# Patient Record
Sex: Female | Born: 1950 | Race: White | Hispanic: No | Marital: Married | Smoking: Never smoker
Health system: Southern US, Community
[De-identification: ages and names within clinical notes are randomized; demographics above are authoritative.]

## PROBLEM LIST (undated history)

## (undated) DIAGNOSIS — E785 Hyperlipidemia, unspecified: Secondary | ICD-10-CM

## (undated) DIAGNOSIS — Z9221 Personal history of antineoplastic chemotherapy: Secondary | ICD-10-CM

## (undated) DIAGNOSIS — C50919 Malignant neoplasm of unspecified site of unspecified female breast: Secondary | ICD-10-CM

## (undated) DIAGNOSIS — E119 Type 2 diabetes mellitus without complications: Secondary | ICD-10-CM

## (undated) DIAGNOSIS — Z923 Personal history of irradiation: Secondary | ICD-10-CM

## (undated) DIAGNOSIS — I10 Essential (primary) hypertension: Secondary | ICD-10-CM

## (undated) HISTORY — DX: Type 2 diabetes mellitus without complications: E11.9

## (undated) HISTORY — DX: Hyperlipidemia, unspecified: E78.5

## (undated) HISTORY — DX: Essential (primary) hypertension: I10

## (undated) MED FILL — Trastuzumab-anns For IV Soln 150 MG: INTRAVENOUS | Qty: 22 | Status: AC

## (undated) MED FILL — Dexamethasone Sodium Phosphate Inj 100 MG/10ML: INTRAMUSCULAR | Qty: 1 | Status: AC

## (undated) MED FILL — Fosaprepitant Dimeglumine For IV Infusion 150 MG (Base Eq): INTRAVENOUS | Qty: 5 | Status: AC

---

## 2017-09-17 ENCOUNTER — Other Ambulatory Visit: Payer: Self-pay | Admitting: Nurse Practitioner

## 2017-09-17 DIAGNOSIS — Z1231 Encounter for screening mammogram for malignant neoplasm of breast: Secondary | ICD-10-CM

## 2017-10-21 ENCOUNTER — Ambulatory Visit
Admission: RE | Admit: 2017-10-21 | Discharge: 2017-10-21 | Disposition: A | Discharge status: Home / Self Care | Payer: Medicare Other | Source: Ambulatory Visit | Attending: Nurse Practitioner | Admitting: Nurse Practitioner

## 2017-10-21 DIAGNOSIS — Z1231 Encounter for screening mammogram for malignant neoplasm of breast: Secondary | ICD-10-CM

## 2019-12-24 ENCOUNTER — Encounter: Payer: Self-pay | Admitting: Internal Medicine

## 2020-02-23 ENCOUNTER — Encounter: Payer: Self-pay | Admitting: Internal Medicine

## 2020-02-23 ENCOUNTER — Ambulatory Visit (INDEPENDENT_AMBULATORY_CARE_PROVIDER_SITE_OTHER): Payer: Medicare Other | Admitting: Internal Medicine

## 2020-02-23 ENCOUNTER — Other Ambulatory Visit: Payer: Self-pay

## 2020-02-23 VITALS — BP 140/78 | HR 86 | Ht 63.0 in | Wt 169.2 lb

## 2020-02-23 DIAGNOSIS — E785 Hyperlipidemia, unspecified: Secondary | ICD-10-CM | POA: Diagnosis not present

## 2020-02-23 DIAGNOSIS — E1165 Type 2 diabetes mellitus with hyperglycemia: Secondary | ICD-10-CM

## 2020-02-23 DIAGNOSIS — Z794 Long term (current) use of insulin: Secondary | ICD-10-CM

## 2020-02-23 HISTORY — DX: Type 2 diabetes mellitus with hyperglycemia: E11.65

## 2020-02-23 LAB — POCT GLYCOSYLATED HEMOGLOBIN (HGB A1C): Hemoglobin A1C: 10.8 % — AB (ref 4.0–5.6)

## 2020-02-23 LAB — GLUCOSE, POCT (MANUAL RESULT ENTRY): POC Glucose: 312 mg/dl — AB (ref 70–99)

## 2020-02-23 MED ORDER — NOVOLOG FLEXPEN 100 UNIT/ML ~~LOC~~ SOPN: 8.0000 [IU] | PEN_INJECTOR | Freq: Three times a day (TID) | SUBCUTANEOUS | 3 refills | Status: DC

## 2020-02-23 MED ORDER — DEXCOM G6 TRANSMITTER MISC: 1.0000 | 3 refills | Status: DC

## 2020-02-23 MED ORDER — INSULIN PEN NEEDLE 32G X 4 MM MISC: 1.0000 | Freq: Four times a day (QID) | 3 refills | Status: DC

## 2020-02-23 MED ORDER — DEXCOM G6 SENSOR MISC: 1.0000 | 11 refills | Status: DC

## 2020-02-23 NOTE — Patient Instructions (Addendum)
-   Continue Ozempic 0.5 mg weekly  - Continue Tresiba 40 units daily  - Start Novolog 8 units with each meal      HOW TO TREAT LOW BLOOD SUGARS (Blood sugar LESS THAN 70 MG/DL)  Please follow the RULE OF 15 for the treatment of hypoglycemia treatment (when your (blood sugars are less than 70 mg/dL)    STEP 1: Take 15 grams of carbohydrates when your blood sugar is low, which includes:   3-4 GLUCOSE TABS  OR  3-4 OZ OF JUICE OR REGULAR SODA OR  ONE TUBE OF GLUCOSE GEL     STEP 2: RECHECK blood sugar in 15 MINUTES STEP 3: If your blood sugar is still low at the 15 minute recheck --> then, go back to STEP 1 and treat AGAIN with another 15 grams of carbohydrates.

## 2020-02-23 NOTE — Addendum Note (Signed)
Addended by: Kaylyn Lim I on: 02/23/2020 01:51 PM   Modules accepted: Orders

## 2020-02-23 NOTE — Progress Notes (Signed)
Rx for [employer] 2 have been faxed to [facility]

## 2020-02-23 NOTE — Progress Notes (Signed)
Name: Heather Waller  MRN/ DOB: [id number], [birth year]   Age/ Sex: 70 y.o., female    PCP: Charlynn Court, NP   Reason for Endocrinology Evaluation: Type 2 Diabetes Mellitus     Date of Initial Endocrinology Visit: 02/23/2020     PATIENT IDENTIFIER: Heather Waller is a 70 y.o. female with a past medical history of T2DM, HTN and dyslipidemia . The patient presented for initial endocrinology clinic visit on 02/23/2020 for consultative assistance with her diabetes management.    HPI: Heather Waller was    Diagnosed with DM many years ago  Prior Medications tried/Intolerance: Intolerant to Metformin due to diarrhea  Currently checking blood sugars 1 x / day Hypoglycemia episodes : no              Hemoglobin A1c has ranged from 10.7% in 2021, peaking at 10.8%  in 2022. Patient required assistance for hypoglycemia: no  Patient has required hospitalization within the last 1 year from hyper or hypoglycemia: no   In terms of diet, the patient eats a smoothie in the breakfast , eats lunch and supper . Avoids sugar-sweetened beverages.  Would sometimes snack mid morning ( on a cracker) .  Has constipation with Ozempic      HOME DIABETES REGIMEN: Ozempic 0.5 mg weekly  ( Wedensdays)  Tyler Aas 40 units daily  At 7 AM      Statin: no ACE-I/ARB: yes Prior Diabetic Education: yes     METER DOWNLOAD SUMMARY: Did not bring       DIABETIC COMPLICATIONS: Microvascular complications:    Denies: CKD, retinopathy, neuropathy   Last eye exam: Completed 2021  Macrovascular complications:    Denies: CAD, PVD, CVA   PAST HISTORY:  Past Medical History: No past medical history on file.  Past Surgical History: N/A   Social History:  reports that she has never smoked. She has never used smokeless tobacco. She reports previous alcohol use. No history on file for drug use.  Family History: No family history on file.   HOME MEDICATIONS: Allergies as of 02/23/2020   No  Known Allergies     Medication List       Accurate as of February 23, 2020  1:47 PM. If you have any questions, ask your nurse or doctor.        Dexcom G6 Sensor Misc 1 Device by Does not apply route as directed. Started by: Dorita Sciara, MD   Dexcom G6 Transmitter Misc 1 Device by Does not apply route as directed. Started by: Dorita Sciara, MD   Insulin Pen Needle 32G X 4 MM Misc 1 Device by Does not apply route in the morning, at noon, in the evening, and at bedtime. What changed:   how much to take  how to take this  when to take this Changed by: Dorita Sciara, MD   NovoLOG FlexPen 100 UNIT/ML FlexPen Generic drug: insulin aspart Inject 8 Units into the skin 3 (three) times daily with meals. Started by: Dorita Sciara, MD   Ozempic (0.25 or 0.5 MG/DOSE) 2 MG/1.5ML Sopn Generic drug: Semaglutide(0.25 or 0.5MG/DOS) Inject 0.5 mLs into the skin once a week.   Tyler Aas FlexTouch 200 UNIT/ML FlexTouch Pen Generic drug: insulin degludec Inject 40 Units into the skin daily.   valsartan-hydrochlorothiazide 80-12.5 MG tablet Commonly known as: DIOVAN-HCT Take 1 tablet by mouth daily.        ALLERGIES: No Known Allergies   REVIEW OF SYSTEMS: A comprehensive ROS was  conducted with the patient and is negative except as per HPI and below:  Review of Systems  Gastrointestinal: Positive for constipation. Negative for nausea.      OBJECTIVE:   VITAL SIGNS: BP 140/78   Pulse 86   Ht _0  (1.6 m)   Wt 169 lb 4 oz (76.8 kg)   SpO2 96%   BMI 29.98 kg/m     PHYSICAL EXAM:  General: Pt appears well and is in NAD  Neck: General: Supple without adenopathy or carotid bruits. Thyroid: Thyroid size normal.  No goiter or nodules appreciated.   Lungs: Clear with good BS bilat with no rales, rhonchi, or wheezes  Heart: RRR   Abdomen: Normoactive bowel sounds, soft, nontender, without masses or organomegaly palpable  Extremities:  Lower  extremities - No pretibial edema. No lesions.  Skin: Normal texture and temperature to palpation.   Neuro: MS is good with appropriate affect, pt is alert and Ox3    DM foot exam: 02/23/2019   The skin of the feet is intact without sores or ulcerations. The pedal pulses are 2+ on right and 2+ on left. The sensation is intact to a screening 5.07, 10 gram monofilament bilaterally    DATA REVIEWED: 11/30/2019 BUN/Cr 10/0.62  GFR 91  Na 137 K 4 Ca 9.3  Alk Phos 69 AST 23 ALT 25   T.Chol 195 Tg 164 HDL 50 LDL 116 A1c 10.7 %  TSH 1.390 B12 962  In Office BG 312 mg/dL   ASSESSMENT / PLAN / RECOMMENDATIONS:   1) Type 2 Diabetes Mellitus, Poorly controlled, Without complications - Most recent A1c of 10.8 %. Goal A1c < 7.0 %.    Plan: GENERAL: I have discussed with the patient the pathophysiology of diabetes. We went over the natural progression of the disease. We talked about both insulin resistance and insulin deficiency. We stressed the importance of lifestyle changes including diet and exercise. I explained the complications associated with diabetes including retinopathy, nephropathy, neuropathy as well as increased risk of cardiovascular disease. We went over the benefit seen with glycemic control.    I explained to the patient that diabetic patients are at higher than normal risk for amputations.  Pt with an A1c > 10.0 % since 07/2019 , given that she has hyperglycemia despite insulin and low carb diet, will check for autoimmune DM.   We discussed adding a prandial insulin, given BG in the office 312 mg/dL despite eating a sala, a small roll and unsweetened beverage. She is in agreement of this, she was advised ot check glucose before each meal, will prescribe Dexcom. She will notify us when she gets it so we can set her up for training   MEDICATIONS: -  Continue Ozempic 0.5 mg weekly  - Continue Tresiba 40 units daily  - Start Novolog 8 units with each meal    EDUCATION / INSTRUCTIONS:  BG monitoring instructions: Patient is instructed to check her blood sugars 3 times a day, before meals.  Call [facility] if: BG persistently < 70  . I reviewed the Rule of 15 for the treatment of hypoglycemia in detail with the patient. Literature supplied.   2) Diabetic complications:   Eye: Does not have known diabetic retinopathy.   Neuro/ Feet: Does not have known diabetic peripheral neuropathy.  Renal: Patient does nothave known baseline CKD. She is on an ACEI/ARB at present.   3) Dyslipidemia: Patient is not on a statin. LDL above goal at 116 mg/dL.  Discussed ADA recommendation for statin use as well as cardiovascular benefits, she declines.    F/U in 3 months      Signed electronically by: Mack Guise, MD  [facility] Endocrin[facility]  [facility] [address] [place], [place] [address] Phone: [phone] FAX: [phone]   CC: Charlynn Court, NP [address] [place] [address] Phone: [phone]  Fax: [phone]    Return to Endocrinology clinic as below: Future Appointments  Date Time Provider [place]  05/24/2020  1:40 PM Shamleffer, Melanie Crazier, MD [facility] None

## 2020-02-24 ENCOUNTER — Encounter: Payer: Self-pay | Admitting: Internal Medicine

## 2020-02-29 NOTE — Progress Notes (Signed)
Faxed Dexcom order to [facility] pharmacy on 1/13/202022.  Re-fax on 02/29/2020 because did not received confirmation from prior.

## 2020-03-02 LAB — GLUTAMIC ACID DECARBOXYLASE AUTO ABS: Glutamic Acid Decarb Ab: <5 IU/mL (ref <5)

## 2020-03-02 LAB — ISLET CELL AB SCREEN RFLX TO TITER: ISLET CELL ANTIBODY SCREEN: NEGATIVE

## 2020-04-06 ENCOUNTER — Other Ambulatory Visit: Payer: Self-pay | Admitting: Nurse Practitioner

## 2020-04-06 DIAGNOSIS — Z1231 Encounter for screening mammogram for malignant neoplasm of breast: Secondary | ICD-10-CM

## 2020-05-24 ENCOUNTER — Other Ambulatory Visit: Payer: Self-pay

## 2020-05-24 ENCOUNTER — Ambulatory Visit (INDEPENDENT_AMBULATORY_CARE_PROVIDER_SITE_OTHER): Payer: Medicare Other | Admitting: Internal Medicine

## 2020-05-24 ENCOUNTER — Encounter: Payer: Self-pay | Admitting: Internal Medicine

## 2020-05-24 VITALS — BP 132/82 | HR 88 | Ht 63.0 in | Wt 176.2 lb

## 2020-05-24 DIAGNOSIS — E1165 Type 2 diabetes mellitus with hyperglycemia: Secondary | ICD-10-CM

## 2020-05-24 LAB — POCT GLYCOSYLATED HEMOGLOBIN (HGB A1C): Hemoglobin A1C: 9 % — AB (ref 4.0–5.6)

## 2020-05-24 MED ORDER — DEXCOM G6 SENSOR MISC: 1.0000 | 11 refills | Status: DC

## 2020-05-24 MED ORDER — DEXCOM G6 TRANSMITTER MISC: 1.0000 | 3 refills | Status: DC

## 2020-05-24 MED ORDER — NOVOLOG FLEXPEN 100 UNIT/ML ~~LOC~~ SOPN: PEN_INJECTOR | SUBCUTANEOUS | 3 refills | Status: DC

## 2020-05-24 NOTE — Progress Notes (Signed)
Name: Heather Waller  Age/ Sex: 70 y.o., female   MRN/ DOB: [id number], [birth year]     PCP: Charlynn Court, NP   Reason for Endocrinology Evaluation: Type 2 Diabetes Mellitus  Initial Endocrine Consultative Visit: 02/23/2020    PATIENT IDENTIFIER: Heather Waller is a 70 y.o. female with a past medical history of T2DM, HTN and dyslipidemia. The patient has followed with Endocrinology clinic since 02/23/2020 for consultative assistance with management of her diabetes.  DIABETIC HISTORY:  Heather Waller was diagnosed with DM yrs ago,Intolerant to Metformin due to diarrhea .  Her hemoglobin A1c has ranged from 10.7% in 2021, peaking at 10.8%  in 2022.   On her initial visit to our clinic she had an A1c of 10.8%, she was on [place] which we continued and added prandial insulin. SUBJECTIVE:   During the last visit (02/23/2020): A1c 10.8% we continued Ozempic, and tresiba and added prandial insulin   Today (05/24/2020): [name] is here for a follow up on diabetes management.  She checks her blood sugars 1 times daily. The patient has not had hypoglycemic episodes since the last clinic visit.  Has been having insomnia  Has occasional diarrhea   HOME DIABETES REGIMEN:  Ozempic 0.5 mg weekly Tresiba 40 units daily NovoLog 8 units 3 times daily before every meal     Statin: Declines (02/2020) ACE-I/ARB: Yes    Unable to Download:  150-242 mg/dL   DIABETIC COMPLICATIONS: Microvascular complications:   Denies: CKD, retinopathy, neuropathy  Last Eye Exam: Completed 2021  Macrovascular complications:    Denies: CAD, CVA, PVD   HISTORY:  Past Medical History:  Past Medical History:  Diagnosis Date  . Diabetes mellitus without complication ([place])   . Hyperlipidemia   . Hypertension     Past Surgical History: No past surgical history on file.  Social History:  reports that she has never smoked. She has never used smokeless tobacco. She reports  previous alcohol use. No history on file for drug use. Family History:  Family History  Problem Relation Age of Onset  . Alzheimer's disease Mother   . Hypertension Father      HOME MEDICATIONS: Allergies as of 05/24/2020   No Known Allergies     Medication List       Accurate as of May 24, 2020  1:38 PM. If you have any questions, ask your nurse or doctor.        Dexcom G6 Sensor Misc 1 Device by Does not apply route as directed.   Dexcom G6 Transmitter Misc 1 Device by Does not apply route as directed.   Insulin Pen Needle 32G X 4 MM Misc 1 Device by Does not apply route in the morning, at noon, in the evening, and at bedtime.   NovoLOG FlexPen 100 UNIT/ML FlexPen Generic drug: insulin aspart Inject 8 Units into the skin 3 (three) times daily with meals.   Ozempic (0.25 or 0.5 MG/DOSE) 2 MG/1.5ML Sopn Generic drug: Semaglutide(0.25 or 0.5MG /DOS) Inject 0.5 mLs into the skin once a week.   Heather Waller FlexTouch 200 UNIT/ML FlexTouch Pen Generic drug: insulin degludec Inject 40 Units into the skin daily.   valsartan-hydrochlorothiazide 80-12.5 MG tablet Commonly known as: DIOVAN-HCT Take 1 tablet by mouth daily.        OBJECTIVE:   Vital Signs: BP 132/82   Pulse 88   Ht 5\' 3"  (1.6 m)   Wt 176 lb 4 oz (79.9 kg)   SpO2 98%   BMI  31.22 kg/m   Wt Readings from Last 3 Encounters:  05/24/20 176 lb 4 oz (79.9 kg)  02/23/20 169 lb 4 oz (76.8 kg)     Exam: General: Pt appears well and is in NAD  Lungs: Clear with good BS bilat   Heart: RRR   Abdomen: Normoactive bowel sounds, soft, nontender, without masses or organomegaly palpable  Extremities: No pretibial edema.   Neuro: MS is good with appropriate affect, pt is alert and Ox3    DM foot exam: 05/24/2020   The skin of the feet is intact without sores or ulcerations. The pedal pulses are 2+ on right and 2+ on left. The sensation is intact to a screening 5.07, 10 gram monofilament  bilaterally   DATA REVIEWED:  Lab Results  Component Value Date   HGBA1C 9.0 (A) 05/24/2020   HGBA1C 10.8 (A) 02/23/2020    04/04/2020 GLuc 196 BUN/Cr 9/0.65 GFR 90 T.chol 173 Tg 97 HDL 56 LDL 99    ASSESSMENT / PLAN / RECOMMENDATIONS:   1) Type 2 Diabetes Mellitus, Poorly controlled, With out complications - Most recent A1c of 9.0%. Goal A1c < 7.0 %.    - Praised the pt on improved glycemic control  - There are not enough glucose data available. I have prescribed DEXCOM to [facility] in the past but this has not been fruitful, will prescribe it again to [facility] this time  - Based o nthe limited glucose data, I will increase her supper dose of Novolog  - Will not increase Ozempic due to occasional diarrhea and nausea   MEDICATIONS:  Continue Ozempic 0.5 mg weekly  - Continue Tresiba 40 units daily  - Change Novolog 8 units with Breakfast , 8 units with Lunch and increase to  10 unit with Supper   EDUCATION / INSTRUCTIONS:  BG monitoring instructions: Patient is instructed to check her blood sugars 3 times a day, before meals .  Call [facility] if: BG persistently < 70  . I reviewed the Rule of 15 for the treatment of hypoglycemia in detail with the patient. Literature supplied.   2) Diabetic complications:   Eye: Does not have known diabetic retinopathy.   Neuro/ Feet: Does not have known diabetic peripheral neuropathy .   Renal: Patient does not have known baseline CKD. She   is  on an ACEI/ARB at present.       F/U in 4 months    Signed electronically by: Mack Guise, MD  [facility] Endocrinology  [facility] [address] [place],  [address] Phone: [phone] FAX: [phone]   CC: Charlynn Court, NP [address] [place] [address] Phone: [phone]  Fax: [phone]  Return to Endocrinology clinic as below: Future Appointments  Date Time Provider [place]  05/24/2020  1:40 PM  Devaeh Amadi, Melanie Crazier, MD [facility] None  05/26/2020  2:20 PM [facility]

## 2020-05-24 NOTE — Patient Instructions (Addendum)
-   Continue Ozempic 0.5 mg weekly  - Continue Tresiba 40 units daily  - Change Novolog 8 units with Breakfast , 8 units with Lunch and 10 unit with Supper      HOW TO TREAT LOW BLOOD SUGARS (Blood sugar LESS THAN 70 MG/DL)  Please follow the RULE OF 15 for the treatment of hypoglycemia treatment (when your (blood sugars are less than 70 mg/dL)    STEP 1: Take 15 grams of carbohydrates when your blood sugar is low, which includes:   3-4 GLUCOSE TABS  OR  3-4 OZ OF JUICE OR REGULAR SODA OR  ONE TUBE OF GLUCOSE GEL     STEP 2: RECHECK blood sugar in 15 MINUTES STEP 3: If your blood sugar is still low at the 15 minute recheck --> then, go back to STEP 1 and treat AGAIN with another 15 grams of carbohydrates.

## 2020-05-26 ENCOUNTER — Ambulatory Visit
Admission: RE | Admit: 2020-05-26 | Discharge: 2020-05-26 | Disposition: A | Discharge status: Home / Self Care | Payer: Medicare Other | Source: Ambulatory Visit | Attending: Nurse Practitioner | Admitting: Nurse Practitioner

## 2020-05-26 ENCOUNTER — Other Ambulatory Visit: Payer: Self-pay

## 2020-05-26 DIAGNOSIS — Z1231 Encounter for screening mammogram for malignant neoplasm of breast: Secondary | ICD-10-CM

## 2020-05-30 ENCOUNTER — Other Ambulatory Visit: Payer: Self-pay | Admitting: Nurse Practitioner

## 2020-05-30 DIAGNOSIS — R928 Other abnormal and inconclusive findings on diagnostic imaging of breast: Secondary | ICD-10-CM

## 2020-06-20 ENCOUNTER — Ambulatory Visit
Admission: RE | Admit: 2020-06-20 | Discharge: 2020-06-20 | Disposition: A | Discharge status: Home / Self Care | Payer: Medicare Other | Source: Ambulatory Visit | Attending: Nurse Practitioner | Admitting: Nurse Practitioner

## 2020-06-20 ENCOUNTER — Other Ambulatory Visit: Payer: Self-pay | Admitting: Nurse Practitioner

## 2020-06-20 ENCOUNTER — Other Ambulatory Visit: Payer: Self-pay

## 2020-06-20 DIAGNOSIS — R928 Other abnormal and inconclusive findings on diagnostic imaging of breast: Secondary | ICD-10-CM

## 2020-06-23 ENCOUNTER — Ambulatory Visit
Admission: RE | Admit: 2020-06-23 | Discharge: 2020-06-23 | Disposition: A | Discharge status: Home / Self Care | Payer: Medicare Other | Source: Ambulatory Visit | Attending: Nurse Practitioner | Admitting: Nurse Practitioner

## 2020-06-23 ENCOUNTER — Other Ambulatory Visit: Payer: Self-pay

## 2020-06-23 DIAGNOSIS — R928 Other abnormal and inconclusive findings on diagnostic imaging of breast: Secondary | ICD-10-CM

## 2020-07-12 HISTORY — PX: BREAST LUMPECTOMY: SHX2

## 2020-07-27 ENCOUNTER — Telehealth: Payer: Self-pay | Admitting: Oncology

## 2020-07-27 NOTE — Telephone Encounter (Signed)
Patient referred by Dr Orrin Brigham for Rt Breast CA.  Appt made for 08/01/20 Labs 10:30 am - Consult 11:00 am  Patient scheduled w/Dr Orlene Erm 6/27

## 2020-08-01 ENCOUNTER — Other Ambulatory Visit: Payer: Self-pay | Admitting: Oncology

## 2020-08-01 DIAGNOSIS — Z170 Estrogen receptor positive status [ER+]: Secondary | ICD-10-CM

## 2020-08-01 DIAGNOSIS — C50411 Malignant neoplasm of upper-outer quadrant of right female breast: Secondary | ICD-10-CM | POA: Insufficient documentation

## 2020-08-01 DIAGNOSIS — C50919 Malignant neoplasm of unspecified site of unspecified female breast: Secondary | ICD-10-CM | POA: Insufficient documentation

## 2020-08-01 NOTE — Progress Notes (Signed)
[facility]  [address] [place],  [place]  [address] [phone]  Clinic Day:  08/02/2020  Referring physician: Charlynn Court, NP   HISTORY OF PRESENT ILLNESS:  The patient is a 70 y.o. female who I was asked to consult upon for newly diagnosed breast cancer.  Her history dates back to April 2022 when her screening mammogram showed an abnormal focus in her right breast.  This led to a diagnostic mammogram being done in May 2022, which revealed a 1 cm mass in her upper outer right breast.  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  She underwent a lumpectomy in June 2022, which showed the lesion measuring 1.1 cm.  Neither of the 2 sentinel lymph nodes removed contained cancer.  She comes in today to go over her breast cancer pathology and its implications.  The patient denies ever noticing any changes in her right breast which ever alerted her to breast cancer being present.  There is no family history of breast or ovarian cancer.  Of note, the patient had not undergone a mammogram in 3 years.    PAST MEDICAL HISTORY:   Past Medical History:  Diagnosis Date   Diabetes mellitus without complication ([place])    Hyperlipidemia    Hypertension     PAST SURGICAL HISTORY:  Right breast lumpectomy  CURRENT MEDICATIONS:   Current Outpatient Medications  Medication Sig Dispense Refill   Continuous Blood Gluc Sensor (DEXCOM G6 SENSOR) MISC 1 Device by Does not apply route as directed. 3 each 11   Continuous Blood Gluc Transmit (DEXCOM G6 TRANSMITTER) MISC 1 Device by Does not apply route as directed. 1 each 3   insulin aspart (NOVOLOG FLEXPEN) 100 UNIT/ML FlexPen Inject 8 Units into the skin 2 (two) times daily with breakfast and lunch AND 10 Units daily with supper. 30 mL 3   Insulin Pen Needle 32G X 4 MM MISC 1 Device by Does not apply  route in the morning, at noon, in the evening, and at bedtime. 400 each 3   OZEMPIC, 0.25 OR 0.5 MG/DOSE, 2 MG/1.5ML SOPN Inject 0.5 mLs into the skin once a week.     TRESIBA FLEXTOUCH 200 UNIT/ML FlexTouch Pen Inject 40 Units into the skin daily.     valsartan-hydrochlorothiazide (DIOVAN-HCT) 80-12.5 MG tablet Take 1 tablet by mouth daily.     No current facility-administered medications for this visit.    ALLERGIES:  Codeine  FAMILY HISTORY:   Family History  Problem Relation Age of Onset   Alzheimer's disease Mother    Hypertension Father     SOCIAL HISTORY:  The patient was born in [place].  She lives [place] town with her husband of 24 years.  She has 2 children.  She taught for 14 years.  There is no history of alcohol or tobacco abuse.    REVIEW OF SYSTEMS:  Review of Systems  Constitutional:  Negative for fatigue and fever.  HENT:   Negative for hearing loss and sore throat.   Eyes:  Negative for eye problems.  Respiratory:  Negative for chest tightness, cough and hemoptysis.   Cardiovascular:  Negative for chest pain and palpitations.  Gastrointestinal:  Negative for abdominal distention, abdominal pain, blood in stool, constipation, diarrhea, nausea and vomiting.  Endocrine: Negative for hot flashes.  Genitourinary:  Negative for difficulty urinating, dysuria, frequency,  hematuria and nocturia.   Musculoskeletal:  Negative for arthralgias, back pain, gait problem and myalgias.  Skin: Negative.  Negative for itching and rash.  Neurological: Negative.  Negative for dizziness, extremity weakness, gait problem, headaches, light-headedness and numbness.  Hematological: Negative.   Psychiatric/Behavioral: Negative.  Negative for depression and suicidal ideas. The patient is not nervous/anxious.     PHYSICAL EXAM:  Blood pressure (!) 158/74, pulse 84, temperature 98.4 F (36.9 C), resp. rate 14, height 5' 3"  (1.6 m), weight 172 lb 14.4 oz (78.4 kg), SpO2 96  %. Wt Readings from Last 3 Encounters:  08/02/20 172 lb 14.4 oz (78.4 kg)  05/24/20 176 lb 4 oz (79.9 kg)  02/23/20 169 lb 4 oz (76.8 kg)   Body mass index is 30.63 kg/m. Performance status (ECOG): 0 - Asymptomatic Physical Exam Constitutional:      Appearance: Normal appearance.  HENT:     Mouth/Throat:     Pharynx: Oropharynx is clear. No oropharyngeal exudate.  Cardiovascular:     Rate and Rhythm: Normal rate and regular rhythm.     Heart sounds: No murmur heard.   No friction rub. No gallop.  Pulmonary:     Breath sounds: Normal breath sounds.  Chest:  Breasts:    Right: No swelling, bleeding, inverted nipple, mass, nipple discharge, skin change, axillary adenopathy or supraclavicular adenopathy.     Left: No swelling, bleeding, inverted nipple, mass, nipple discharge, skin change, axillary adenopathy or supraclavicular adenopathy.  Abdominal:     General: Bowel sounds are normal. There is no distension.     Palpations: Abdomen is soft. There is no mass.     Tenderness: There is no abdominal tenderness.  Musculoskeletal:        General: No tenderness.     Cervical back: Normal range of motion and neck supple.     Right lower leg: No edema.     Left lower leg: No edema.  Lymphadenopathy:     Cervical: No cervical adenopathy.     Right cervical: No superficial, deep or posterior cervical adenopathy.    Left cervical: No superficial, deep or posterior cervical adenopathy.     Upper Body:     Right upper body: No supraclavicular or axillary adenopathy.     Left upper body: No supraclavicular or axillary adenopathy.     Lower Body: No right inguinal adenopathy. No left inguinal adenopathy.  Skin:    Coloration: Skin is not jaundiced.     Findings: No lesion or rash.  Neurological:     General: No focal deficit present.     Mental Status: She is alert and oriented to person, place, and time. Mental status is at baseline.  Psychiatric:        Mood and Affect: Mood  normal.        Behavior: Behavior normal.        Thought Content: Thought content normal.        Judgment: Judgment normal.    ASSESSMENT & PLAN:  A 70 y.o. female who I was asked to consult upon for stage IA (T1c N0 M0) hormone/Her2 positive breast cancer.  The patient understands more aggressive therapy, in the form of a Herceptin-based regimen, will need to be implemented in the adjuvant setting.  I will place her on Taxotere/Carboplatin/Herceptin, which she will receive once every 3 weeks for a total of 6 cycles.  Herceptin would be continued afterwards every 3 weeks to complete one year of Her2-directed therapy.  The  patient was made aware of the side effects that go along with this regimen, including alopecia, fatigue, nausea, and neuropathy.  I will arrange for her to see Dr Lilia Pro, who will place a port through which her chemotherapy will be given.  As a component of her cancer is hormone sensitive, I will also place her on letrozole after her 6 cycles of chemotherapy for her adjuvant endocrine therapy.  She is already scheduled to see radiation oncology next week to discuss potential adjuvant breast radiation.  The patient understands that all therapy to be given is being done with curative intent.  Her 1st cycle of TCH will commence within the next 2 weeks.  I will see her back 3 weeks later before she heads into her 2nd cycle of adjuvant TCH chemotherapy.  The patient understands all the plans discussed today and is in agreement with them.  I do appreciate Charlynn Court, NP for his new consult.   Meiko Stranahan Macarthur Critchley, MD

## 2020-08-02 ENCOUNTER — Inpatient Hospital Stay: Payer: Medicare Other | Attending: Oncology | Admitting: Oncology

## 2020-08-02 ENCOUNTER — Other Ambulatory Visit: Payer: Self-pay

## 2020-08-02 ENCOUNTER — Inpatient Hospital Stay: Payer: Medicare Other

## 2020-08-02 DIAGNOSIS — C50911 Malignant neoplasm of unspecified site of right female breast: Secondary | ICD-10-CM | POA: Diagnosis not present

## 2020-08-02 DIAGNOSIS — Z170 Estrogen receptor positive status [ER+]: Secondary | ICD-10-CM

## 2020-08-03 ENCOUNTER — Encounter: Payer: Self-pay | Admitting: Oncology

## 2020-08-03 ENCOUNTER — Other Ambulatory Visit: Payer: Self-pay | Admitting: Oncology

## 2020-08-03 ENCOUNTER — Other Ambulatory Visit: Payer: Self-pay | Admitting: Hematology and Oncology

## 2020-08-03 DIAGNOSIS — C50911 Malignant neoplasm of unspecified site of right female breast: Secondary | ICD-10-CM

## 2020-08-03 DIAGNOSIS — T451X5A Adverse effect of antineoplastic and immunosuppressive drugs, initial encounter: Secondary | ICD-10-CM

## 2020-08-03 DIAGNOSIS — I427 Cardiomyopathy due to drug and external agent: Secondary | ICD-10-CM

## 2020-08-03 NOTE — Progress Notes (Signed)
START ON PATHWAY REGIMEN - Breast     Cycle 1: A cycle is 21 days:     Trastuzumab-xxxx      Docetaxel      Carboplatin    Cycles 2 through 6: A cycle is every 21 days:     Trastuzumab-xxxx      Docetaxel      Carboplatin    Cycles 7 through 17: A cycle is every 21 days:     Trastuzumab-xxxx   **Always confirm dose/schedule in your pharmacy ordering system**  Patient Characteristics: Postoperative without Neoadjuvant Therapy (Pathologic Staging), Invasive Disease, Adjuvant Therapy, HER2 Positive, ER Positive, Node Negative, pT1c, pN0/N97m Therapeutic Status: Postoperative without Neoadjuvant Therapy (Pathologic Staging) AJCC Grade: G3 AJCC N Category: pN0 AJCC M Category: cM0 ER Status: Positive (+) AJCC 8 Stage Grouping: IA HER2 Status: Positive (+) Oncotype Dx Recurrence Score: Not Appropriate AJCC T Category: pT1c PR Status: Positive (+) Adjuvant Therapy Status: No Adjuvant Therapy Received Yet or Changing Initial Adjuvant Regimen due to Tolerance Intent of Therapy: Curative Intent, Discussed with Patient

## 2020-08-07 ENCOUNTER — Inpatient Hospital Stay (INDEPENDENT_AMBULATORY_CARE_PROVIDER_SITE_OTHER): Payer: Medicare Other | Admitting: Hematology and Oncology

## 2020-08-07 ENCOUNTER — Encounter: Payer: Self-pay | Admitting: Oncology

## 2020-08-07 ENCOUNTER — Inpatient Hospital Stay: Payer: Medicare Other

## 2020-08-07 LAB — CREATININE, SERUM: Creatinine: 1 (ref <=1.1)

## 2020-08-07 MED ORDER — PROCHLORPERAZINE MALEATE 10 MG PO TABS: 10.0000 mg | ORAL_TABLET | Freq: Four times a day (QID) | ORAL | 3 refills | Status: DC | PRN

## 2020-08-07 MED ORDER — ONDANSETRON HCL 4 MG PO TABS: 4.0000 mg | ORAL_TABLET | ORAL | 3 refills | Status: DC | PRN

## 2020-08-07 MED ORDER — DEXAMETHASONE 4 MG PO TABS: ORAL_TABLET | ORAL | 0 refills | Status: DC

## 2020-08-07 NOTE — Addendum Note (Signed)
Addended by: Dayton Scrape on: 08/07/2020 03:41 PM   Modules accepted: Level of Service

## 2020-08-07 NOTE — Progress Notes (Signed)
The patient is a 70 year old female with newly diagnosed breast cancer.  Patient presents to clinic today with her husband for chemotherapy education and palliative care consult.  We will start docetaxel, carboplatin and trastuzumab.  We will send in prescriptions for dexamethasone, prochlorperazine and ondansetron.  The patient verbalizes understanding of and agreement to the plan as discussed today.  Provided general information including the following: 1.  Date of education: 08/07/2020 2.  Physician name: Dr. Bobby Rumpf 3.  Diagnosis: Breast Cancer 4.  Stage: stage IA (ER+, PR+, HER+) 5.  Curative  6.  Chemotherapy plan including drugs and how often: docetaxel,carboplatin, trastuzumab 7.  Start date: Pending authorization 8.  Other referrals: None at this time 9.  The patient is to call our office with any questions or concerns.  Our office number [phone], if after hours or on the weekend, call the same number and wait for the answering service.  There is always an oncologist on call 10.  Medications prescribed: Dexamethasone 11.  The patient has verbalized understanding of the treatment plan and has no barriers to adherence or understanding.  Obtained signed consent from patient.  Discussed symptoms including 1.  Low blood counts including red blood cells, white blood cells and platelets. 2. Infection including to avoid large crowds, wash hands frequently, and stay away from people who were sick.  If fever develops of 100.4 or higher, call our office. 3.  Mucositis-given instructions on mouth rinse (baking soda and salt mixture).  Keep mouth clean.  Use soft bristle toothbrush.  If mouth sores develop, call our clinic. 4.  Nausea/vomiting-gave prescriptions for ondansetron 4 mg every 4 hours as needed for nausea, may take around the clock if persistent.  Compazine 10 mg every 6 hours, may take around the clock if persistent. 5.  Diarrhea-use over-the-counter Imodium.  Call clinic if not  controlled. 6.  Constipation-use senna, 1 to 2 tablets twice a day.  If no BM in 2 to 3 days call the clinic. 7.  Loss of appetite-try to eat small meals every 2-3 hours.  Call clinic if not eating. 8.  Taste changes-zinc 500 mg daily.  If becomes severe call clinic. 9.  Alcoholic beverages. 10.  Drink 2 to 3 quarts of water per day. 11.  Peripheral neuropathy-patient to call if numbness or tingling in hands or feet is persistent  Neulasta-will be given 24 to 48 hours after chemotherapy.  Gave information sheet on bone and joint pain.  Use Claritin or Pepcid.  May use ibuprofen or Aleve.  Call if symptoms persist or are unbearable.  Gave information on the supportive care team and how to contact them regarding services.  Discussed advanced directives.  The patient does not have their advanced directives but will look at the copy provided in their notebook and will call with any questions. [employer] Social worker Advanced directives  Answered questions to patient satisfaction.  Patient is to call with any further questions or concerns.  Dayton Scrape, FNP- [facility]

## 2020-08-10 DIAGNOSIS — C50919 Malignant neoplasm of unspecified site of unspecified female breast: Secondary | ICD-10-CM | POA: Diagnosis not present

## 2020-08-10 DIAGNOSIS — T451X1A Poisoning by antineoplastic and immunosuppressive drugs, accidental (unintentional), initial encounter: Secondary | ICD-10-CM

## 2020-08-11 ENCOUNTER — Encounter: Payer: Self-pay | Admitting: Oncology

## 2020-08-15 ENCOUNTER — Telehealth: Payer: Self-pay

## 2020-08-15 NOTE — Telephone Encounter (Signed)
@  930- I called and spoke with pt regarding when to take the dexamethasone - 2 tabs po am before chemo and the morning of chemo, with food. Pt is a diabetic and is concerned with how her blood sugars will do with steroid treatment. She normally takes insulin TID. I informed her she will need to check her blood sugar more often for few days once steroid is started. She will contact her endocrinologist PRN if levels increase dramatically. We then discussed the use of the Compazine & Zofran. Pt will be receiving Emend IV during treatment infusion, so I told her she will not use the Zofran for 3 days. She understands that she will begin using the Compazine after infusion treatment q 6hr to avoid nausea/vomiting for at least the 1st 3 days. On day 4, she can add the Zofran q 4hr PRN and alternate with Compazine 10mg  q 6 PRN nausea. Pt wrote down these directions, while I was on the phone with her. We discussed that the antiemetics can cause dry mouth, and constipation. She will start senna 1 tab po BID and increase as needed to maintain having a BM @ least every 2 days. She will purchase Miralax as well. I encouraged her to increase her po fluids now. She will also be reminded of need to increase po fluids after chemotherapy by infusion nurses.  Pt encouraged to call me anytime with questions and concerns. She verbalized understanding.     @[phone] - Pt LVM on triage line requesting to speak with Melissa,NP, regarding when to take medications.

## 2020-08-16 ENCOUNTER — Other Ambulatory Visit: Payer: Self-pay | Admitting: Hematology and Oncology

## 2020-08-16 ENCOUNTER — Other Ambulatory Visit: Payer: Self-pay | Admitting: Oncology

## 2020-08-16 ENCOUNTER — Encounter: Payer: Self-pay | Admitting: Oncology

## 2020-08-16 ENCOUNTER — Other Ambulatory Visit: Payer: Self-pay

## 2020-08-16 ENCOUNTER — Inpatient Hospital Stay: Payer: Medicare Other | Attending: Oncology

## 2020-08-16 DIAGNOSIS — I10 Essential (primary) hypertension: Secondary | ICD-10-CM | POA: Insufficient documentation

## 2020-08-16 DIAGNOSIS — Z7689 Persons encountering health services in other specified circumstances: Secondary | ICD-10-CM | POA: Insufficient documentation

## 2020-08-16 DIAGNOSIS — Z170 Estrogen receptor positive status [ER+]: Secondary | ICD-10-CM | POA: Insufficient documentation

## 2020-08-16 DIAGNOSIS — C50411 Malignant neoplasm of upper-outer quadrant of right female breast: Secondary | ICD-10-CM | POA: Insufficient documentation

## 2020-08-16 DIAGNOSIS — Z5111 Encounter for antineoplastic chemotherapy: Secondary | ICD-10-CM | POA: Insufficient documentation

## 2020-08-16 DIAGNOSIS — E785 Hyperlipidemia, unspecified: Secondary | ICD-10-CM | POA: Insufficient documentation

## 2020-08-16 DIAGNOSIS — E119 Type 2 diabetes mellitus without complications: Secondary | ICD-10-CM | POA: Insufficient documentation

## 2020-08-16 LAB — BASIC METABOLIC PANEL
BUN: 20 (ref 4–21)
CO2: 27 — AB (ref 13–22)
Chloride: 100 (ref 99–108)
Creatinine: 0.5 (ref 0.5–1.1)
Glucose: 222
Potassium: 4.1 (ref 3.4–5.3)
Sodium: 137 (ref 137–147)

## 2020-08-16 LAB — CBC AND DIFFERENTIAL
HCT: 44 (ref 36–46)
Hemoglobin: 14.8 (ref 12.0–16.0)
Neutrophils Absolute: 7.2
Platelets: 265 (ref 150–399)
WBC: 11.8

## 2020-08-16 LAB — HEPATIC FUNCTION PANEL
ALT: 25 (ref 7–35)
AST: 27 (ref 13–35)
Alkaline Phosphatase: 70 (ref 25–125)
Bilirubin, Total: 0.4

## 2020-08-16 LAB — CBC: RBC: 4.82 (ref 3.87–5.11)

## 2020-08-16 LAB — COMPREHENSIVE METABOLIC PANEL
Albumin: 4.4 (ref 3.5–5.0)
Calcium: 9.7 (ref 8.7–10.7)

## 2020-08-17 ENCOUNTER — Inpatient Hospital Stay: Payer: Medicare Other

## 2020-08-17 ENCOUNTER — Other Ambulatory Visit: Payer: Self-pay

## 2020-08-17 ENCOUNTER — Other Ambulatory Visit: Payer: Self-pay | Admitting: Hematology and Oncology

## 2020-08-17 VITALS — BP 176/79 | HR 98 | Temp 98.2°F | Resp 18 | Ht 63.0 in | Wt 174.0 lb

## 2020-08-17 DIAGNOSIS — E119 Type 2 diabetes mellitus without complications: Secondary | ICD-10-CM | POA: Diagnosis not present

## 2020-08-17 DIAGNOSIS — Z170 Estrogen receptor positive status [ER+]: Secondary | ICD-10-CM | POA: Diagnosis not present

## 2020-08-17 DIAGNOSIS — C50411 Malignant neoplasm of upper-outer quadrant of right female breast: Secondary | ICD-10-CM | POA: Diagnosis present

## 2020-08-17 DIAGNOSIS — I10 Essential (primary) hypertension: Secondary | ICD-10-CM | POA: Diagnosis not present

## 2020-08-17 DIAGNOSIS — E785 Hyperlipidemia, unspecified: Secondary | ICD-10-CM | POA: Diagnosis not present

## 2020-08-17 DIAGNOSIS — Z7689 Persons encountering health services in other specified circumstances: Secondary | ICD-10-CM | POA: Diagnosis not present

## 2020-08-17 DIAGNOSIS — Z5111 Encounter for antineoplastic chemotherapy: Secondary | ICD-10-CM | POA: Diagnosis present

## 2020-08-17 MED ORDER — SODIUM CHLORIDE 0.9 % IV SOLN
10.0000 mg | Freq: Once | INTRAVENOUS | Status: AC
  Administered 2020-08-17: 10 mg via INTRAVENOUS
  Filled 2020-08-17: qty 10

## 2020-08-17 MED ORDER — PALONOSETRON HCL INJECTION 0.25 MG/5ML
INTRAVENOUS | Status: AC
  Filled 2020-08-17: qty 5

## 2020-08-17 MED ORDER — PALONOSETRON HCL INJECTION 0.25 MG/5ML
0.2500 mg | Freq: Once | INTRAVENOUS | Status: AC
  Administered 2020-08-17: 0.25 mg via INTRAVENOUS

## 2020-08-17 MED ORDER — SODIUM CHLORIDE 0.9 % IV SOLN
Freq: Once | INTRAVENOUS | Status: AC
  Filled 2020-08-17: qty 250

## 2020-08-17 MED ORDER — HEPARIN SOD (PORK) LOCK FLUSH 100 UNIT/ML IV SOLN
500.0000 [IU] | Freq: Once | INTRAVENOUS | Status: AC | PRN
  Administered 2020-08-17: 500 [IU]
  Filled 2020-08-17: qty 5

## 2020-08-17 MED ORDER — ACETAMINOPHEN 325 MG PO TABS
ORAL_TABLET | ORAL | Status: AC
  Filled 2020-08-17: qty 2

## 2020-08-17 MED ORDER — DIPHENHYDRAMINE HCL 25 MG PO CAPS
ORAL_CAPSULE | ORAL | Status: AC
  Filled 2020-08-17: qty 2

## 2020-08-17 MED ORDER — SODIUM CHLORIDE 0.9% FLUSH
10.0000 mL | INTRAVENOUS | Status: DC | PRN
  Administered 2020-08-17: 10 mL
  Filled 2020-08-17: qty 10

## 2020-08-17 MED ORDER — SODIUM CHLORIDE 0.9 % IV SOLN
533.4000 mg | Freq: Once | INTRAVENOUS | Status: AC
  Administered 2020-08-17: 530 mg via INTRAVENOUS
  Filled 2020-08-17: qty 53

## 2020-08-17 MED ORDER — SODIUM CHLORIDE 0.9 % IV SOLN
75.0000 mg/m2 | Freq: Once | INTRAVENOUS | Status: AC
  Administered 2020-08-17: 140 mg via INTRAVENOUS
  Filled 2020-08-17: qty 14

## 2020-08-17 MED ORDER — LIDOCAINE-PRILOCAINE 2.5-2.5 % EX CREA: 1.0000 "application " | TOPICAL_CREAM | CUTANEOUS | 0 refills | Status: DC | PRN

## 2020-08-17 MED ORDER — SODIUM CHLORIDE 0.9 % IV SOLN
150.0000 mg | Freq: Once | INTRAVENOUS | Status: AC
  Administered 2020-08-17: 150 mg via INTRAVENOUS
  Filled 2020-08-17: qty 150

## 2020-08-17 MED ORDER — DIPHENHYDRAMINE HCL 25 MG PO CAPS
50.0000 mg | ORAL_CAPSULE | Freq: Once | ORAL | Status: AC
  Administered 2020-08-17: 50 mg via ORAL

## 2020-08-17 MED ORDER — TRASTUZUMAB-ANNS CHEMO 150 MG IV SOLR
8.0000 mg/kg | Freq: Once | INTRAVENOUS | Status: AC
  Administered 2020-08-17: 630 mg via INTRAVENOUS
  Filled 2020-08-17: qty 30

## 2020-08-17 MED ORDER — ACETAMINOPHEN 325 MG PO TABS
650.0000 mg | ORAL_TABLET | Freq: Once | ORAL | Status: AC
  Administered 2020-08-17: 650 mg via ORAL

## 2020-08-17 NOTE — Patient Instructions (Signed)
Carboplatin injection What is this medication? CARBOPLATIN (KAR boe pla tin) is a chemotherapy drug. It targets fast dividing cells, like cancer cells, and causes these cells to die. This medicine is usedto treat ovarian cancer and many other cancers. This medicine may be used for other purposes; ask your health care provider orpharmacist if you have questions. COMMON BRAND NAME(S): Paraplatin What should I tell my care team before I take this medication? They need to know if you have any of these conditions: blood disorders hearing problems kidney disease recent or ongoing radiation therapy an unusual or allergic reaction to carboplatin, cisplatin, other chemotherapy, other medicines, foods, dyes, or preservatives pregnant or trying to get pregnant breast-feeding How should I use this medication? This drug is usually given as an infusion into a vein. It is administered in [place] or clinic by a specially trained health care professional. Talk to your pediatrician regarding the use of this medicine in children.Special care may be needed. Overdosage: If you think you have taken too much of this medicine contact apoison control center or emergency room at once. NOTE: This medicine is only for you. Do not share this medicine with others. What if I miss a dose? It is important not to miss a dose. Call your doctor or health careprofessional if you are unable to keep an appointment. What may interact with this medication? medicines for seizures medicines to increase blood counts like filgrastim, pegfilgrastim, sargramostim some antibiotics like amikacin, gentamicin, neomycin, streptomycin, tobramycin vaccines Talk to your doctor or health care professional before taking any of thesemedicines: acetaminophen aspirin ibuprofen ketoprofen naproxen This list may not describe all possible interactions. Give your health care provider a list of all the medicines, herbs, non-prescription drugs, or  dietary supplements you use. Also tell them if you smoke, drink alcohol, or use illegaldrugs. Some items may interact with your medicine. What should I watch for while using this medication? Your condition will be monitored carefully while you are receiving this medicine. You will need important blood work done while you are taking thismedicine. This drug may make you feel generally unwell. This is not uncommon, as chemotherapy can affect healthy cells as well as cancer cells. Report any side effects. Continue your course of treatment even though you feel ill unless yourdoctor tells you to stop. In some cases, you may be given additional medicines to help with side effects.Follow all directions for their use. Call your doctor or health care professional for advice if you get a fever, chills or sore throat, or other symptoms of a cold or flu. Do not treat yourself. This drug decreases your body's ability to fight infections. Try toavoid being around people who are sick. This medicine may increase your risk to bruise or bleed. Call your doctor orhealth care professional if you notice any unusual bleeding. Be careful brushing and flossing your teeth or using a toothpick because you may get an infection or bleed more easily. If you have any dental work done,tell your dentist you are receiving this medicine. Avoid taking products that contain aspirin, acetaminophen, ibuprofen, naproxen, or ketoprofen unless instructed by your doctor. These medicines may hide afever. Do not become pregnant while taking this medicine. Women should inform their doctor if they wish to become pregnant or think they might be pregnant. There is a potential for serious side effects to an unborn child. Talk to your health care professional or pharmacist for more information. Do not breast-feed aninfant while taking this medicine. What side effects may  I notice from receiving this medication? Side effects that you should report to your  doctor or health care professionalas soon as possible: allergic reactions like skin rash, itching or hives, swelling of the face, lips, or tongue signs of infection - fever or chills, cough, sore throat, pain or difficulty passing urine signs of decreased platelets or bleeding - bruising, pinpoint red spots on the skin, black, tarry stools, nosebleeds signs of decreased red blood cells - unusually weak or tired, fainting spells, lightheadedness breathing problems changes in hearing changes in vision chest pain high blood pressure low blood counts - This drug may decrease the number of white blood cells, red blood cells and platelets. You may be at increased risk for infections and bleeding. nausea and vomiting pain, swelling, redness or irritation at the injection site pain, tingling, numbness in the hands or feet problems with balance, talking, walking trouble passing urine or change in the amount of urine Side effects that usually do not require medical attention (report to yourdoctor or health care professional if they continue or are bothersome): hair loss loss of appetite metallic taste in the mouth or changes in taste This list may not describe all possible side effects. Call your doctor for medical advice about side effects. You may report side effects to FDA at1-800-FDA-1088. Where should I keep my medication? This drug is given in a hospital or clinic and will not be stored at home. NOTE: This sheet is a summary. It may not cover all possible information. If you have questions about this medicine, talk to your doctor, pharmacist, orhealth care provider.  2022 Elsevier/Gold Standard (2007-05-05 14:38:05) Trastuzumab injection for infusion What is this medication? TRASTUZUMAB (tras TOO zoo mab) is a monoclonal antibody. It is used to treatbreast cancer and stomach cancer. This medicine may be used for other purposes; ask your health care provider orpharmacist if you have  questions. COMMON BRAND NAME(S): Herceptin, Janae Bridgeman, Ontruzant, Trazimera What should I tell my care team before I take this medication? They need to know if you have any of these conditions: heart disease heart failure lung or breathing disease, like asthma an unusual or allergic reaction to trastuzumab, benzyl alcohol, or other medications, foods, dyes, or preservatives pregnant or trying to get pregnant breast-feeding How should I use this medication? This drug is given as an infusion into a vein. It is administered in a hospitalor clinic by a specially trained health care professional. Talk to your pediatrician regarding the use of this medicine in children. Thismedicine is not approved for use in children. Overdosage: If you think you have taken too much of this medicine contact apoison control center or emergency room at once. NOTE: This medicine is only for you. Do not share this medicine with others. What if I miss a dose? It is important not to miss a dose. Call your doctor or health careprofessional if you are unable to keep an appointment. What may interact with this medication? This medicine may interact with the following medications: certain types of chemotherapy, such as daunorubicin, doxorubicin, epirubicin, and idarubicin This list may not describe all possible interactions. Give your health care provider a list of all the medicines, herbs, non-prescription drugs, or dietary supplements you use. Also tell them if you smoke, drink alcohol, or use illegaldrugs. Some items may interact with your medicine. What should I watch for while using this medication? Visit your doctor for checks on your progress. Report any side effects. Continue your course of treatment even  though you feel ill unless your doctortells you to stop. Call your doctor or health care professional for advice if you get a fever, chills or sore throat, or other symptoms of a cold or flu. Do not  treatyourself. Try to avoid being around people who are sick. You may experience fever, chills and shaking during your first infusion. These effects are usually mild and can be treated with other medicines. Report any side effects during the infusion to your health care professional. Fever andchills usually do not happen with later infusions. Do not become pregnant while taking this medicine or for 7 months after stopping it. Women should inform their doctor if they wish to become pregnant or think they might be pregnant. Women of child-bearing potential will need to have a negative pregnancy test before starting this medicine. There is a potential for serious side effects to an unborn child. Talk to your health care professional or pharmacist for more information. Do not breast-feed an infantwhile taking this medicine or for 7 months after stopping it. Women must use effective birth control with this medicine. What side effects may I notice from receiving this medication? Side effects that you should report to your doctor or health care professionalas soon as possible: allergic reactions like skin rash, itching or hives, swelling of the face, lips, or tongue chest pain or palpitations cough dizziness feeling faint or lightheaded, falls fever general ill feeling or flu-like symptoms signs of worsening heart failure like breathing problems; swelling in your legs and feet unusually weak or tired Side effects that usually do not require medical attention (report to yourdoctor or health care professional if they continue or are bothersome): bone pain changes in taste diarrhea joint pain nausea/vomiting weight loss This list may not describe all possible side effects. Call your doctor for medical advice about side effects. You may report side effects to FDA at1-800-FDA-1088. Where should I keep my medication? This drug is given in a hospital or clinic and will not be stored at home. NOTE: This  sheet is a summary. It may not cover all possible information. If you have questions about this medicine, talk to your doctor, pharmacist, orhealth care provider.  2022 Elsevier/Gold Standard (2016-01-23 14:37:52) Docetaxel injection What is this medication? DOCETAXEL (doe se TAX el) is a chemotherapy drug. It targets fast dividing cells, like cancer cells, and causes these cells to die. This medicine is used to treat many types of cancers like breast cancer, certain stomach cancers,head and neck cancer, lung cancer, and prostate cancer. This medicine may be used for other purposes; ask your health care provider orpharmacist if you have questions. COMMON BRAND NAME(S): Docefrez, Taxotere What should I tell my care team before I take this medication? They need to know if you have any of these conditions: infection (especially a virus infection such as chickenpox, cold sores, or herpes) liver disease low blood counts, like low white cell, platelet, or red cell counts an unusual or allergic reaction to docetaxel, polysorbate 80, other chemotherapy agents, other medicines, foods, dyes, or preservatives pregnant or trying to get pregnant breast-feeding How should I use this medication? This drug is given as an infusion into a vein. It is administered in a hospitalor clinic by a specially trained health care professional. Talk to your pediatrician regarding the use of this medicine in children.Special care may be needed. Overdosage: If you think you have taken too much of this medicine contact apoison control center or emergency room at once. NOTE: This  medicine is only for you. Do not share this medicine with others. What if I miss a dose? It is important not to miss your dose. Call your doctor or health careprofessional if you are unable to keep an appointment. What may interact with this medication? Do not take this medicine with any of the following medications: live virus vaccines This  medicine may also interact with the following medications: aprepitant certain antibiotics like erythromycin or clarithromycin certain antivirals for HIV or hepatitis certain medicines for fungal infections like fluconazole, itraconazole, ketoconazole, posaconazole, or voriconazole cimetidine ciprofloxacin conivaptan cyclosporine dronedarone fluvoxamine grapefruit juice imatinib verapamil This list may not describe all possible interactions. Give your health care provider a list of all the medicines, herbs, non-prescription drugs, or dietary supplements you use. Also tell them if you smoke, drink alcohol, or use illegaldrugs. Some items may interact with your medicine. What should I watch for while using this medication? Your condition will be monitored carefully while you are receiving this medicine. You will need important blood work done while you are taking thismedicine. Call your doctor or health care professional for advice if you get a fever, chills or sore throat, or other symptoms of a cold or flu. Do not treat yourself. This drug decreases your body's ability to fight infections. Try toavoid being around people who are sick. Some products may contain alcohol. Ask your health care professional if this medicine contains alcohol. Be sure to tell all health care professionals you are taking this medicine. Certain medicines, like metronidazole and disulfiram, can cause an unpleasant reaction when taken with alcohol. The reaction includes flushing, headache, nausea, vomiting, sweating, and increased thirst. Thereaction can last from 30 minutes to several hours. You may get drowsy or dizzy. Do not drive, use machinery, or do anything that needs mental alertness until you know how this medicine affects you. Do not stand or sit up quickly, especially if you are an older patient. This reduces the risk of dizzy or fainting spells. Alcohol may interfere with the effect ofthis medicine. Talk to your  health care professional about your risk of cancer. You may bemore at risk for certain types of cancer if you take this medicine. Do not become pregnant while taking this medicine or for 6 months after stopping it. Women should inform their doctor if they wish to become pregnant or think they might be pregnant. There is a potential for serious side effects to an unborn child. Talk to your health care professional or pharmacist for more information. Do not breast-feed an infant while taking this medicine orfor 1 week after stopping it. Males who get this medicine must use a condom during sex with females who can get pregnant. If you get a woman pregnant, the baby could have birth defects. The baby could die before they are born. You will need to continue wearing a condom for 3 months after stopping the medicine. Tell your health care providerright away if your partner becomes pregnant while you are taking this medicine. This may interfere with the ability to father a child. You should talk to yourdoctor or health care professional if you are concerned about your fertility. What side effects may I notice from receiving this medication? Side effects that you should report to your doctor or health care professionalas soon as possible: allergic reactions like skin rash, itching or hives, swelling of the face, lips, or tongue blurred vision breathing problems changes in vision low blood counts - This drug may decrease the number  of white blood cells, red blood cells and platelets. You may be at increased risk for infections and bleeding. nausea and vomiting pain, redness or irritation at site where injected pain, tingling, numbness in the hands or feet redness, blistering, peeling, or loosening of the skin, including inside the mouth signs of decreased platelets or bleeding - bruising, pinpoint red spots on the skin, black, tarry stools, nosebleeds signs of decreased red blood cells - unusually weak or  tired, fainting spells, lightheadedness signs of infection - fever or chills, cough, sore throat, pain or difficulty passing urine swelling of the ankle, feet, hands Side effects that usually do not require medical attention (report to yourdoctor or health care professional if they continue or are bothersome): constipation diarrhea fingernail or toenail changes hair loss loss of appetite mouth sores muscle pain This list may not describe all possible side effects. Call your doctor for medical advice about side effects. You may report side effects to FDA at1-800-FDA-1088. Where should I keep my medication? This drug is given in a hospital or clinic and will not be stored at home. NOTE: This sheet is a summary. It may not cover all possible information. If you have questions about this medicine, talk to your doctor, pharmacist, orhealth care provider.  2022 Elsevier/Gold Standard (2018-12-28 19:50:31)

## 2020-08-18 ENCOUNTER — Telehealth: Payer: Self-pay

## 2020-08-18 ENCOUNTER — Other Ambulatory Visit: Payer: Self-pay

## 2020-08-18 ENCOUNTER — Inpatient Hospital Stay: Payer: Medicare Other

## 2020-08-18 VITALS — BP 158/74 | HR 88 | Temp 98.2°F | Resp 18 | Ht 63.0 in | Wt 173.0 lb

## 2020-08-18 DIAGNOSIS — Z5111 Encounter for antineoplastic chemotherapy: Secondary | ICD-10-CM | POA: Diagnosis not present

## 2020-08-18 DIAGNOSIS — C50411 Malignant neoplasm of upper-outer quadrant of right female breast: Secondary | ICD-10-CM

## 2020-08-18 MED ORDER — PEGFILGRASTIM-BMEZ 6 MG/0.6ML ~~LOC~~ SOSY
PREFILLED_SYRINGE | SUBCUTANEOUS | Status: AC
  Filled 2020-08-18: qty 0.6

## 2020-08-18 MED ORDER — PEGFILGRASTIM-BMEZ 6 MG/0.6ML ~~LOC~~ SOSY
6.0000 mg | PREFILLED_SYRINGE | Freq: Once | SUBCUTANEOUS | Status: AC
  Administered 2020-08-18: 6 mg via SUBCUTANEOUS

## 2020-08-18 NOTE — Progress Notes (Signed)
..  Pharmacist Chemotherapy Monitoring - Initial Assessment    Anticipated start date: 08/17/20  The following has been reviewed per standard work regarding the patient's treatment regimen: The patient's diagnosis, treatment plan and drug doses, and organ/hematologic function Lab orders and baseline tests specific to treatment regimen  The treatment plan start date, drug sequencing, and pre-medications Prior authorization status  Patient's documented medication list, including drug-drug interaction screen and prescriptions for anti-emetics and supportive care specific to the treatment regimen The drug concentrations, fluid compatibility, administration routes, and timing of the medications to be used The patient's access for treatment and lifetime cumulative dose history, if applicable  The patient's medication allergies and previous infusion related reactions, if applicable   Changes made to treatment plan:  switch to insurance preferred biosimilar  Follow up needed:  N/A   Juanetta Beets, [facility], 08/16/20  3:45 PM

## 2020-08-18 NOTE — Telephone Encounter (Signed)
I spoke with pt. She reports fatigue all evening yesterday, so napped. She denies N/V, rash, nor fever. Pt reminded to call us if temp of 100.4 or higher, DAY OR NIGHT@[phone]. I also educated her about taking Claritin 10mg  po qd  the day of injection and for total of 5 days (to help prevent s/e of bone pain). Pt wrote all of this information down on paper, as we were speaking. She verbalizes understanding. I encouraged her to call me anytime with questions and concerns.

## 2020-08-18 NOTE — Patient Instructions (Signed)

## 2020-09-04 NOTE — Progress Notes (Signed)
[facility]  [address] [place],  [place]  [address] [phone]  Clinic Day:  09/07/2020  Referring physician: Charlynn Court, NP   CHIEF COMPLAINT:  CC: Stage I A hormone and her 2 Neu positive breast cancer  Current Treatment:   Adjuvant docetaxel/carboplatin Amie Critchley   HISTORY OF PRESENT ILLNESS:  Heather Waller is a 70 y.o. female with stage IA (T1c N0 M0) hormone/Her2 positive right breast cancer diagnosed in May.  Screening mammogram in April showed an abnormal focus in the right breast.  Diagnostic mammogram in May revealed a 1 cm mass in the upper outer right breast.  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  Estrogen, progesterone and HER2/neu receptors were positive. She had not undergone a mammogram for 3 years. She underwent lumpectomy in June. Final pathology revealed a 1.1 cm, grade 3, invasive ductal carcinoma with 2 negative sentinel nodes. She is receiving adjuvant chemotherapy/HER2 directed therapy with docetaxel/carboplatin/trastuzumab [facility]) with plans to continue trastuzumab for a total of 1 year of therapy. She receives PEG filgrastim on day 2 to prevent complications of prolonged neutropenia.  She has seen Dr. Orlene Erm regarding adjuvant breast radiation. We also plan for adjuvant hormonal therapy upon completion of the chemotherapy portion of her treatment.  INTERVAL HISTORY:  Heather Waller is here today for repeat clinical assessment prior to a 2nd cycle of [place].  She states she tolerated her 1st cycle well. Her only complaint is pain of  bilateral feet and legs after her PEG filgrastim injection despite taking Claritin.  She states the nurse told her to only take 1 Tylenol to premedicate at home prior to treatment.  I advised her that she can use Tylenol as needed as a preferred pain reliever. She denies fevers or chills. She denies pain. Her appetite is good. Her weight has decreased 3 pounds over last 3 weeks .  She has a  history of vitamin-D deficiency and asks if we can check her level today.  She is taking vitamin D3 10,000 international units daily.  She is also taking an oregano supplement, which she states is a natural antibiotic.  REVIEW OF SYSTEMS:  Review of Systems  Constitutional:  Negative for appetite change, chills, fatigue, fever and unexpected weight change.  HENT:   Negative for lump/mass, mouth sores and sore throat.   Respiratory:  Negative for cough and shortness of breath.   Cardiovascular:  Negative for chest pain and leg swelling.  Gastrointestinal:  Negative for abdominal pain, constipation, diarrhea, nausea and vomiting.  Endocrine: Negative for hot flashes.  Genitourinary:  Negative for difficulty urinating, dysuria, frequency and hematuria.   Musculoskeletal:  Negative for arthralgias, back pain and myalgias.  Skin:  Negative for rash.  Neurological:  Negative for dizziness and headaches.  Hematological:  Negative for adenopathy. Does not bruise/bleed easily.  Psychiatric/Behavioral:  Negative for depression and sleep disturbance. The patient is not nervous/anxious.     VITALS:  Blood pressure (!) 159/69, pulse 80, temperature 98.3 F (36.8 C), resp. rate 16, height 5' 3"  (1.6 m), weight 169 lb 12.8 oz (77 kg), SpO2 93 %.  Wt Readings from Last 3 Encounters:  09/05/20 169 lb 12.8 oz (77 kg)  08/18/20 173 lb (78.5 kg)  08/17/20 174 lb (78.9 kg)    Body mass index is 30.08 kg/m.  Performance status (ECOG): 0 - Asymptomatic  PHYSICAL EXAM:  Physical Exam Vitals and nursing note reviewed.  Constitutional:      General: She is  not in acute distress.    Appearance: Normal appearance.  HENT:     Head: Normocephalic and atraumatic.     Mouth/Throat:     Mouth: Mucous membranes are moist.     Pharynx: Oropharynx is clear. No oropharyngeal exudate or posterior oropharyngeal erythema.  Eyes:     General: No scleral icterus.    Extraocular Movements: Extraocular movements  intact.     Conjunctiva/sclera: Conjunctivae normal.     Pupils: Pupils are equal, round, and reactive to light.  Cardiovascular:     Rate and Rhythm: Normal rate and regular rhythm.     Heart sounds: Normal heart sounds. No murmur heard.   No friction rub. No gallop.  Pulmonary:     Effort: Pulmonary effort is normal.     Breath sounds: Normal breath sounds. No wheezing, rhonchi or rales.  Chest:  Breasts:    Right: Normal. No swelling, bleeding, inverted nipple, mass, nipple discharge, skin change, tenderness, axillary adenopathy or supraclavicular adenopathy.     Left: Normal. No swelling, bleeding, inverted nipple, mass, nipple discharge, skin change, tenderness, axillary adenopathy or supraclavicular adenopathy.  Abdominal:     General: There is no distension.     Palpations: Abdomen is soft. There is no hepatomegaly, splenomegaly or mass.     Tenderness: There is no abdominal tenderness.  Musculoskeletal:        General: Normal range of motion.     Cervical back: Normal range of motion and neck supple. No tenderness.     Right lower leg: No edema.     Left lower leg: No edema.  Lymphadenopathy:     Cervical: No cervical adenopathy.     Upper Body:     Right upper body: No supraclavicular or axillary adenopathy.     Left upper body: No supraclavicular or axillary adenopathy.     Lower Body: No right inguinal adenopathy. No left inguinal adenopathy.  Skin:    General: Skin is warm and dry.     Coloration: Skin is not jaundiced.     Findings: No rash.  Neurological:     Mental Status: She is alert and oriented to person, place, and time.     Cranial Nerves: No cranial nerve deficit.  Psychiatric:        Mood and Affect: Mood normal.        Behavior: Behavior normal.        Thought Content: Thought content normal.   LABS:   CBC Latest Ref Rng & Units 09/05/2020 08/16/2020  WBC - 7.9 11.8  Hemoglobin 12.0 - 16.0 13.4 14.8  Hematocrit 36 - 46 40 44  Platelets 150 - 399  412(A) 265   CMP Latest Ref Rng & Units 09/05/2020 08/16/2020 08/07/2020  BUN 4 - 21 11 20  -  Creatinine 0.5 - 1.1 0.5 0.5 1.0  Sodium 137 - 147 133(A) 137 -  Potassium 3.4 - 5.3 4.4 4.1 -  Chloride 99 - 108 100 100 -  CO2 13 - 22 26(A) 27(A) -  Calcium 8.7 - 10.7 8.7 9.7 -  Alkaline Phos 25 - 125 120 70 -  AST 13 - 35 32 27 -  ALT 7 - 35 32 25 -     No results found for: CEA1 / No results found for: CEA1 No results found for: PSA1 No results found for: ZVJ282 No results found for: CAN125  No results found for: TOTALPROTELP, ALBUMINELP, A1GS, A2GS, BETS, BETA2SER, GAMS, MSPIKE, SPEI No results found  for: TIBC, FERRITIN, IRONPCTSAT No results found for: LDH  STUDIES:  No results found.    HISTORY:   Past Medical History:  Diagnosis Date   Diabetes mellitus without complication ([place])    Hyperlipidemia    Hypertension     History reviewed. No pertinent surgical history.  Family History  Problem Relation Age of Onset   Alzheimer's disease Mother    Hypertension Father     Social History:  reports that she has never smoked. She has never used smokeless tobacco. She reports previous alcohol use. No history on file for drug use.The patient is accompanied by her husband today.  Allergies:  Allergies  Allergen Reactions   Codeine    Iodine    Oxycodone     Current Medications: Current Outpatient Medications  Medication Sig Dispense Refill   Continuous Blood Gluc Sensor (DEXCOM G6 SENSOR) MISC 1 Device by Does not apply route as directed. 3 each 11   Continuous Blood Gluc Transmit (DEXCOM G6 TRANSMITTER) MISC 1 Device by Does not apply route as directed. 1 each 3   dexamethasone (DECADRON) 4 MG tablet Take 2 tabs the day before and 2 tab the morning of chemotherapy 60 tablet 0   insulin aspart (NOVOLOG FLEXPEN) 100 UNIT/ML FlexPen Inject 8 Units into the skin 2 (two) times daily with breakfast and lunch AND 10 Units daily with supper. 30 mL 3   Insulin Pen Needle 32G X  4 MM MISC 1 Device by Does not apply route in the morning, at noon, in the evening, and at bedtime. 400 each 3   lidocaine-prilocaine (EMLA) cream Apply 1 application topically as needed. 30 g 0   loratadine (CLARITIN) 10 MG tablet Take 10 mg by mouth daily. Start day of WBC injection and daily for 5 days.     ondansetron (ZOFRAN) 4 MG tablet Take 1 tablet (4 mg total) by mouth every 4 (four) hours as needed for nausea. 90 tablet 3   OZEMPIC, 0.25 OR 0.5 MG/DOSE, 2 MG/1.5ML SOPN Inject 0.5 mLs into the skin once a week.     prochlorperazine (COMPAZINE) 10 MG tablet Take 1 tablet (10 mg total) by mouth every 6 (six) hours as needed for nausea or vomiting. 90 tablet 3   TRESIBA FLEXTOUCH 200 UNIT/ML FlexTouch Pen Inject 40 Units into the skin daily.     valsartan-hydrochlorothiazide (DIOVAN-HCT) 80-12.5 MG tablet Take 1 tablet by mouth daily.     No current facility-administered medications for this visit.   Facility-Administered Medications Ordered in Other Visits  Medication Dose Route Frequency Provider Last Rate Last Admin   sodium chloride flush (NS) 0.9 % injection 10 mL  10 mL Intracatheter PRN Bobby Rumpf, Dequincy A, MD   10 mL at 09/07/20 1344     ASSESSMENT & PLAN:   Assessment/Plan:  Heather Waller is a 70 y.o. female with stage IA (T1c N0 M0) hormone/Her2 positive breast cancer. She tolerated her 1st cycle of [place] fairly well. She reports pain in her legs after PEG filgrastim. If the Tylenol is not effective for pain, she will contact us. Vitamin D was added today and was normal.  We will plan to see her back in 3 weeks with a CBC and comprehensive metabolic panel prior to her 3rd cycle of TCH. The patient and her husband understand the plans discussed today and are in agreement with them.  They know to contact our office she has worsening pain or develops other concerns prior to her next appointment.  Heather Pickles, PA-C

## 2020-09-05 ENCOUNTER — Other Ambulatory Visit: Payer: Self-pay

## 2020-09-05 ENCOUNTER — Telehealth: Payer: Self-pay | Admitting: Hematology and Oncology

## 2020-09-05 ENCOUNTER — Inpatient Hospital Stay (INDEPENDENT_AMBULATORY_CARE_PROVIDER_SITE_OTHER): Payer: Medicare Other | Admitting: Hematology and Oncology

## 2020-09-05 ENCOUNTER — Encounter: Payer: Self-pay | Admitting: Hematology and Oncology

## 2020-09-05 ENCOUNTER — Inpatient Hospital Stay: Payer: Medicare Other

## 2020-09-05 VITALS — BP 159/69 | HR 80 | Temp 98.3°F | Resp 16 | Ht 63.0 in | Wt 169.8 lb

## 2020-09-05 DIAGNOSIS — C50411 Malignant neoplasm of upper-outer quadrant of right female breast: Secondary | ICD-10-CM

## 2020-09-05 DIAGNOSIS — Z170 Estrogen receptor positive status [ER+]: Secondary | ICD-10-CM | POA: Diagnosis not present

## 2020-09-05 DIAGNOSIS — E559 Vitamin D deficiency, unspecified: Secondary | ICD-10-CM

## 2020-09-05 DIAGNOSIS — Z5111 Encounter for antineoplastic chemotherapy: Secondary | ICD-10-CM | POA: Diagnosis not present

## 2020-09-05 LAB — VITAMIN D 25 HYDROXY (VIT D DEFICIENCY, FRACTURES): Vit D, 25-Hydroxy: 55.67 ng/mL (ref 30–100)

## 2020-09-05 LAB — CBC: RBC: 4.42 (ref 3.87–5.11)

## 2020-09-05 LAB — BASIC METABOLIC PANEL
BUN: 11 (ref 4–21)
CO2: 26 — AB (ref 13–22)
Chloride: 100 (ref 99–108)
Creatinine: 0.5 (ref 0.5–1.1)
Glucose: 280
Potassium: 4.4 (ref 3.4–5.3)
Sodium: 133 — AB (ref 137–147)

## 2020-09-05 LAB — CBC AND DIFFERENTIAL
HCT: 40 (ref 36–46)
Hemoglobin: 13.4 (ref 12.0–16.0)
Neutrophils Absolute: 5.29
Platelets: 412 — AB (ref 150–399)
WBC: 7.9

## 2020-09-05 LAB — COMPREHENSIVE METABOLIC PANEL
Albumin: 3.6 (ref 3.5–5.0)
Calcium: 8.7 (ref 8.7–10.7)

## 2020-09-05 LAB — HEPATIC FUNCTION PANEL
ALT: 32 (ref 7–35)
AST: 32 (ref 13–35)
Alkaline Phosphatase: 120 (ref 25–125)
Bilirubin, Total: 0.3

## 2020-09-05 NOTE — Telephone Encounter (Signed)
Per 7/26 los next appt scheduled and given to patient 

## 2020-09-07 ENCOUNTER — Encounter: Payer: Self-pay | Admitting: Oncology

## 2020-09-07 ENCOUNTER — Ambulatory Visit: Payer: Medicare Other | Admitting: Hematology and Oncology

## 2020-09-07 ENCOUNTER — Other Ambulatory Visit: Payer: Self-pay

## 2020-09-07 ENCOUNTER — Inpatient Hospital Stay: Payer: Medicare Other

## 2020-09-07 DIAGNOSIS — C50411 Malignant neoplasm of upper-outer quadrant of right female breast: Secondary | ICD-10-CM

## 2020-09-07 DIAGNOSIS — Z5111 Encounter for antineoplastic chemotherapy: Secondary | ICD-10-CM | POA: Diagnosis not present

## 2020-09-07 DIAGNOSIS — Z170 Estrogen receptor positive status [ER+]: Secondary | ICD-10-CM

## 2020-09-07 MED ORDER — PALONOSETRON HCL INJECTION 0.25 MG/5ML
0.2500 mg | Freq: Once | INTRAVENOUS | Status: AC
  Administered 2020-09-07: 0.25 mg via INTRAVENOUS

## 2020-09-07 MED ORDER — TRASTUZUMAB-ANNS CHEMO 150 MG IV SOLR
6.0000 mg/kg | Freq: Once | INTRAVENOUS | Status: AC
  Administered 2020-09-07: 462 mg via INTRAVENOUS
  Filled 2020-09-07: qty 22

## 2020-09-07 MED ORDER — ACETAMINOPHEN 325 MG PO TABS
ORAL_TABLET | ORAL | Status: AC
  Filled 2020-09-07: qty 2

## 2020-09-07 MED ORDER — SODIUM CHLORIDE 0.9 % IV SOLN
Freq: Once | INTRAVENOUS | Status: AC
Start: 2020-09-07 — End: 2020-09-07
  Filled 2020-09-07: qty 250

## 2020-09-07 MED ORDER — DIPHENHYDRAMINE HCL 25 MG PO CAPS
50.0000 mg | ORAL_CAPSULE | Freq: Once | ORAL | Status: AC
  Administered 2020-09-07: 50 mg via ORAL

## 2020-09-07 MED ORDER — SODIUM CHLORIDE 0.9 % IV SOLN
10.0000 mg | Freq: Once | INTRAVENOUS | Status: AC
  Administered 2020-09-07: 10 mg via INTRAVENOUS
  Filled 2020-09-07: qty 10

## 2020-09-07 MED ORDER — SODIUM CHLORIDE 0.9 % IV SOLN
530.0000 mg | Freq: Once | INTRAVENOUS | Status: AC
  Administered 2020-09-07: 530 mg via INTRAVENOUS
  Filled 2020-09-07: qty 53

## 2020-09-07 MED ORDER — PALONOSETRON HCL INJECTION 0.25 MG/5ML
INTRAVENOUS | Status: AC
  Filled 2020-09-07: qty 5

## 2020-09-07 MED ORDER — ACETAMINOPHEN 325 MG PO TABS
650.0000 mg | ORAL_TABLET | Freq: Once | ORAL | Status: AC
  Administered 2020-09-07: 650 mg via ORAL

## 2020-09-07 MED ORDER — SODIUM CHLORIDE 0.9% FLUSH
10.0000 mL | INTRAVENOUS | Status: DC | PRN
  Administered 2020-09-07: 10 mL
  Filled 2020-09-07: qty 10

## 2020-09-07 MED ORDER — DIPHENHYDRAMINE HCL 25 MG PO CAPS
ORAL_CAPSULE | ORAL | Status: AC
  Filled 2020-09-07: qty 2

## 2020-09-07 MED ORDER — SODIUM CHLORIDE 0.9 % IV SOLN
75.0000 mg/m2 | Freq: Once | INTRAVENOUS | Status: AC
  Administered 2020-09-07: 140 mg via INTRAVENOUS
  Filled 2020-09-07: qty 14

## 2020-09-07 MED ORDER — SODIUM CHLORIDE 0.9 % IV SOLN
150.0000 mg | Freq: Once | INTRAVENOUS | Status: AC
  Administered 2020-09-07: 150 mg via INTRAVENOUS
  Filled 2020-09-07: qty 150

## 2020-09-07 MED ORDER — HEPARIN SOD (PORK) LOCK FLUSH 100 UNIT/ML IV SOLN
500.0000 [IU] | Freq: Once | INTRAVENOUS | Status: AC | PRN
  Administered 2020-09-07: 500 [IU]
  Filled 2020-09-07: qty 5

## 2020-09-07 NOTE — Progress Notes (Signed)
1359: PT STABLE AT TIME OF DISCHARGE

## 2020-09-07 NOTE — Patient Instructions (Signed)
[facility]  Discharge Instructions: Thank you for choosing [facility] to provide your oncology and hematology care.  If you have a lab appointment with the [facility], please go directly to the [facility] and check in at the registration area.   Wear comfortable clothing and clothing appropriate for easy access to any Portacath or PICC line.   We strive to give you quality time with your provider. You may need to reschedule your appointment if you arrive late (15 or more minutes).  Arriving late affects you and other patients whose appointments are after yours.  Also, if you miss three or more appointments without notifying the office, you may be dismissed from the clinic at the provider's discretion.      For prescription refill requests, have your pharmacy contact our office and allow 72 hours for refills to be completed.    Today you received the following chemotherapy and/or immunotherapy agents Docetaxel, Carboplatin,Trastuzumab      To help prevent nausea and vomiting after your treatment, we encourage you to take your nausea medication as directed.  BELOW ARE SYMPTOMS THAT SHOULD BE REPORTED IMMEDIATELY: *FEVER GREATER THAN 100.4 F (38 C) OR HIGHER *CHILLS OR SWEATING *NAUSEA AND VOMITING THAT IS NOT CONTROLLED WITH YOUR NAUSEA MEDICATION *UNUSUAL SHORTNESS OF BREATH *UNUSUAL BRUISING OR BLEEDING *URINARY PROBLEMS (pain or burning when urinating, or frequent urination) *BOWEL PROBLEMS (unusual diarrhea, constipation, pain near the anus) TENDERNESS IN MOUTH AND THROAT WITH OR WITHOUT PRESENCE OF ULCERS (sore throat, sores in mouth, or a toothache) UNUSUAL RASH, SWELLING OR PAIN  UNUSUAL VAGINAL DISCHARGE OR ITCHING   Items with * indicate a potential emergency and should be followed up as soon as possible or go to the Emergency Department if any problems should occur.  Please show the CHEMOTHERAPY ALERT CARD or IMMUNOTHERAPY ALERT  CARD at check-in to the Emergency Department and triage nurse.  Should you have questions after your visit or need to cancel or reschedule your appointment, please contact [facility]  Dept: [phone]  and follow the prompts.  Office hours are 8:00 a.m. to 4:30 p.m. Monday - Friday. Please note that voicemails left after 4:00 p.m. may not be returned until the following business day.  We are closed weekends and major holidays. You have access to a nurse at all times for urgent questions. Please call the main number to the clinic Dept: [phone] and follow the prompts.  For any non-urgent questions, you may also contact your provider using MyChart. We now offer e-Visits for anyone 36 and older to request care online for non-urgent symptoms. For details visit [email or web address].   Also download the MyChart app! Go to the app store, search "MyChart", open the app, select [facility], and log in with your MyChart username and password.  Due to Covid, a mask is required upon entering the hospital/clinic. If you do not have a mask, one will be given to you upon arrival. For doctor visits, patients may have 1 support person aged 12 or older with them. For treatment visits, patients cannot have anyone with them due to current Covid guidelines and our immunocompromised population.

## 2020-09-08 ENCOUNTER — Inpatient Hospital Stay: Payer: Medicare Other

## 2020-09-08 VITALS — BP 124/52 | HR 92 | Temp 98.8°F | Resp 16 | Wt 167.0 lb

## 2020-09-08 DIAGNOSIS — Z5111 Encounter for antineoplastic chemotherapy: Secondary | ICD-10-CM | POA: Diagnosis not present

## 2020-09-08 DIAGNOSIS — Z170 Estrogen receptor positive status [ER+]: Secondary | ICD-10-CM

## 2020-09-08 DIAGNOSIS — C50411 Malignant neoplasm of upper-outer quadrant of right female breast: Secondary | ICD-10-CM

## 2020-09-08 MED ORDER — PEGFILGRASTIM-BMEZ 6 MG/0.6ML ~~LOC~~ SOSY
6.0000 mg | PREFILLED_SYRINGE | Freq: Once | SUBCUTANEOUS | Status: AC
  Administered 2020-09-08: 6 mg via SUBCUTANEOUS

## 2020-09-08 MED ORDER — PEGFILGRASTIM-BMEZ 6 MG/0.6ML ~~LOC~~ SOSY
PREFILLED_SYRINGE | SUBCUTANEOUS | Status: AC
  Filled 2020-09-08: qty 0.6

## 2020-09-08 NOTE — Patient Instructions (Signed)

## 2020-09-08 NOTE — Progress Notes (Signed)
Discharged home, stable  

## 2020-09-11 ENCOUNTER — Encounter: Payer: Self-pay | Admitting: Oncology

## 2020-09-14 NOTE — Progress Notes (Signed)
[facility]  [address] [place],  [place]  [address] [phone]  Clinic Day:  09/26/2020  Referring physician: Charlynn Court, NP  This document serves as a record of services personally performed by [name], MD. It was created on their behalf by Curry,Lauren E, a trained medical scribe. The creation of this record is based on the scribe's personal observations and the provider's statements to them.  HISTORY OF PRESENT ILLNESS:  Heather Waller is a 70 y.o. female with stage IA (T1c N0 M0) hormone/Her2 positive right breast cancer, status post a lumpectomy in June 2022.  She comes in today for clinical assessment prior to a 3rd cycle of adjuvant TCH chemotherapy.  The patient claims to have tolerated her 2nd cycle of chemotherapy very well.  She denies having any particular changes in her breast or elsewhere which concern her for early disease recurrence.  VITALS:  Blood pressure (!) 164/72, pulse 85, temperature 98.3 F (36.8 C), resp. rate 16, height _0  (1.6 m), weight 168 lb 6.4 oz (76.4 kg), SpO2 94 %.  Wt Readings from Last 3 Encounters:  09/26/20 168 lb 6.4 oz (76.4 kg)  09/08/20 167 lb 0.6 oz (75.8 kg)  09/05/20 169 lb 12.8 oz (77 kg)    Body mass index is 29.83 kg/m.  Performance status (ECOG): 0 - Asymptomatic  PHYSICAL EXAM:  Physical Exam Constitutional:      General: She is not in acute distress.    Appearance: Normal appearance. She is normal weight.  HENT:     Head: Normocephalic and atraumatic.  Eyes:     General: No scleral icterus.    Extraocular Movements: Extraocular movements intact.     Conjunctiva/sclera: Conjunctivae normal.     Pupils: Pupils are equal, round, and reactive to light.  Cardiovascular:     Rate and Rhythm: Normal rate and regular rhythm.     Pulses: Normal pulses.     Heart sounds: Normal heart sounds. No murmur heard.   No friction rub. No gallop.  Pulmonary:     Effort: Pulmonary  effort is normal. No respiratory distress.     Breath sounds: Normal breath sounds.  Abdominal:     General: Bowel sounds are normal. There is no distension.     Palpations: Abdomen is soft. There is no hepatomegaly, splenomegaly or mass.     Tenderness: There is no abdominal tenderness.  Musculoskeletal:        General: Normal range of motion.     Cervical back: Normal range of motion and neck supple.     Right lower leg: No edema.     Left lower leg: No edema.  Lymphadenopathy:     Cervical: No cervical adenopathy.  Skin:    General: Skin is warm and dry.  Neurological:     General: No focal deficit present.     Mental Status: She is alert and oriented to person, place, and time. Mental status is at baseline.  Psychiatric:        Mood and Affect: Mood normal.        Behavior: Behavior normal.        Thought Content: Thought content normal.        Judgment: Judgment normal.   LABS:   CBC Latest Ref Rng & Units 09/26/2020 09/05/2020 08/16/2020  WBC - 11.9 7.9 11.8  Hemoglobin 12.0 - 16.0 13.8 13.4 14.8  Hematocrit 36 - 46 41 40 44  Platelets 150 -  399 277 412(A) 265   CMP Latest Ref Rng & Units 09/05/2020 08/16/2020 08/07/2020  BUN 4 - _0 -  Creatinine 0.5 - 1.1 0.5 0.5 1.0  Sodium 137 - 147 133(A) 137 -  Potassium 3.4 - 5.3 4.4 4.1 -  Chloride 99 - 108 100 100 -  CO2 13 - 22 26(A) 27(A) -  Calcium 8.7 - 10.7 8.7 9.7 -  Alkaline Phos 25 - 125 120 70 -  AST 13 - 35 32 27 -  ALT 7 - 35 32 25 -   ASSESSMENT & PLAN:  Assessment/Plan:  Heather Waller is a 70 y.o. female with stage IA (T1c N0 M0) hormone/Her2 positive breast cancer. She will proceed with her 3rd cycle of TCH this week.  She will continue to receive Neulasta with each cycle of chemotherapy to prevent severe neutropenia from delaying successive cycles of treatment.  I will see her back in 3 weeks before she heads into her 4th cycle of [place]. The patient understands all the plans discussed today and is in agreement  with them.    I, [name], am acting as scribe for [name], MD    I have reviewed this report as typed by the medical scribe, and it is complete and accurate.  Dequincy Macarthur Critchley, MD

## 2020-09-26 ENCOUNTER — Inpatient Hospital Stay: Payer: Medicare Other | Attending: Oncology | Admitting: Oncology

## 2020-09-26 ENCOUNTER — Inpatient Hospital Stay: Payer: Medicare Other

## 2020-09-26 ENCOUNTER — Other Ambulatory Visit: Payer: Self-pay | Admitting: Oncology

## 2020-09-26 ENCOUNTER — Encounter: Payer: Self-pay | Admitting: Oncology

## 2020-09-26 ENCOUNTER — Telehealth: Payer: Self-pay | Admitting: Oncology

## 2020-09-26 VITALS — BP 164/72 | HR 85 | Temp 98.3°F | Resp 16 | Ht 63.0 in | Wt 168.4 lb

## 2020-09-26 DIAGNOSIS — C50411 Malignant neoplasm of upper-outer quadrant of right female breast: Secondary | ICD-10-CM | POA: Diagnosis not present

## 2020-09-26 DIAGNOSIS — Z5189 Encounter for other specified aftercare: Secondary | ICD-10-CM | POA: Insufficient documentation

## 2020-09-26 DIAGNOSIS — Z170 Estrogen receptor positive status [ER+]: Secondary | ICD-10-CM | POA: Diagnosis not present

## 2020-09-26 DIAGNOSIS — Z5111 Encounter for antineoplastic chemotherapy: Secondary | ICD-10-CM | POA: Insufficient documentation

## 2020-09-26 LAB — CBC AND DIFFERENTIAL
HCT: 41 (ref 36–46)
Hemoglobin: 13.8 (ref 12.0–16.0)
Neutrophils Absolute: 8.21
Platelets: 277 (ref 150–399)
WBC: 11.9

## 2020-09-26 LAB — BASIC METABOLIC PANEL
BUN: 14 (ref 4–21)
CO2: 29 — AB (ref 13–22)
Chloride: 99 (ref 99–108)
Creatinine: 0.5 (ref 0.5–1.1)
Glucose: 227
Potassium: 4.2 (ref 3.4–5.3)
Sodium: 137 (ref 137–147)

## 2020-09-26 LAB — HEPATIC FUNCTION PANEL
ALT: 22 (ref 7–35)
AST: 28 (ref 13–35)
Alkaline Phosphatase: 84 (ref 25–125)
Bilirubin, Total: 0.2

## 2020-09-26 LAB — CBC: RBC: 4.53 (ref 3.87–5.11)

## 2020-09-26 LAB — COMPREHENSIVE METABOLIC PANEL
Albumin: 4 (ref 3.5–5.0)
Calcium: 9.3 (ref 8.7–10.7)

## 2020-09-26 NOTE — Telephone Encounter (Signed)
Per 8/16 LOS next appt scheduled and confirmed by patient

## 2020-09-27 ENCOUNTER — Other Ambulatory Visit: Payer: Self-pay

## 2020-09-27 ENCOUNTER — Encounter: Payer: Self-pay | Admitting: Internal Medicine

## 2020-09-27 ENCOUNTER — Ambulatory Visit (INDEPENDENT_AMBULATORY_CARE_PROVIDER_SITE_OTHER): Payer: Medicare Other | Admitting: Internal Medicine

## 2020-09-27 VITALS — BP 142/80 | HR 105 | Ht 64.5 in | Wt 169.2 lb

## 2020-09-27 DIAGNOSIS — E1165 Type 2 diabetes mellitus with hyperglycemia: Secondary | ICD-10-CM | POA: Diagnosis not present

## 2020-09-27 LAB — POCT GLYCOSYLATED HEMOGLOBIN (HGB A1C): Hemoglobin A1C: 9 % — AB (ref 4.0–5.6)

## 2020-09-27 MED ORDER — FREESTYLE LIBRE 2 SENSOR MISC: 1.0000 | 11 refills | Status: DC

## 2020-09-27 MED ORDER — OZEMPIC (1 MG/DOSE) 4 MG/3ML ~~LOC~~ SOPN: 1.0000 mg | PEN_INJECTOR | SUBCUTANEOUS | 3 refills | Status: DC

## 2020-09-27 MED ORDER — NOVOLOG FLEXPEN 100 UNIT/ML ~~LOC~~ SOPN: PEN_INJECTOR | SUBCUTANEOUS | 3 refills | Status: DC

## 2020-09-27 MED ORDER — TRESIBA FLEXTOUCH 200 UNIT/ML ~~LOC~~ SOPN: 55.0000 [IU] | PEN_INJECTOR | Freq: Every day | SUBCUTANEOUS | 3 refills | Status: DC

## 2020-09-27 NOTE — Progress Notes (Signed)
Name: Heather Waller  Age/ Sex: 70 y.o., female   MRN/ DOB: [id number], 08/12/1950     PCP: Charlynn Court, NP   Reason for Endocrinology Evaluation: Type 2 Diabetes Mellitus  Initial Endocrine Consultative Visit: 02/23/2020    PATIENT IDENTIFIER: Heather Waller is a 70 y.o. female with a past medical history of T2DM, HTN , Hx breast CA (status postlumpectomy 07/2020 )and dyslipidemia. The patient has followed with Endocrinology clinic since 02/23/2020 for consultative assistance with management of her diabetes.     DIABETIC HISTORY:  Heather Waller was diagnosed with DM yrs ago,Intolerant to Metformin due to diarrhea .  Her hemoglobin A1c has ranged from 10.7% in 2021, peaking at 10.8%  in 2022.   On her initial visit to our clinic she had an A1c of 10.8%, she was on [place] which we continued and added prandial insulin. SUBJECTIVE:   During the last visit (05/24/2020): A1c 9.0 % we continued Ozempic, and tresiba and added prandial insulin     Today (09/27/2020): Heather Waller is here for a follow up on diabetes management.  She checks her blood sugars 1 times daily. The patient has not had hypoglycemic episodes since the last clinic visit.   She is status post right lumpectomy secondary to breast cancer 07/2020, she is undergoing chemo and pending radiation  She is on dexamethasone 4 mg 2 tabs for 2 days before chemo  Q 3 weeks   Denies nausea or vomiting    HOME DIABETES REGIMEN:  Ozempic 0.5 mg weekly Tresiba 40 units daily NovoLog 09/18/08 units      Statin: Declines (02/2020) ACE-I/ARB: Yes    Unable to Download:  [id number] [id number]   DIABETIC COMPLICATIONS: Microvascular complications:  Denies: CKD, retinopathy, neuropathy Last Eye Exam: Completed 2021  Macrovascular complications:   Denies: CAD, CVA, PVD   HISTORY:  Past Medical History:  Past Medical History:  Diagnosis Date   Diabetes mellitus without complication ([place])    Hyperlipidemia     Hypertension    Past Surgical History: No past surgical history on file. Social History:  reports that she has never smoked. She has never used smokeless tobacco. She reports that she does not currently use alcohol. No history on file for drug use. Family History:  Family History  Problem Relation Age of Onset   Alzheimer's disease Mother    Hypertension Father      HOME MEDICATIONS: Allergies as of 09/27/2020       Reactions   Codeine    Iodine    Oxycodone         Medication List        Accurate as of September 27, 2020  1:58 PM. If you have any questions, ask your nurse or doctor.          dexamethasone 4 MG tablet Commonly known as: DECADRON Take 2 tabs the day before and 2 tab the morning of chemotherapy   Dexcom G6 Sensor Misc 1 Device by Does not apply route as directed.   Dexcom G6 Transmitter Misc 1 Device by Does not apply route as directed.   Insulin Pen Needle 32G X 4 MM Misc 1 Device by Does not apply route in the morning, at noon, in the evening, and at bedtime.   lidocaine-prilocaine cream Commonly known as: EMLA Apply 1 application topically as needed.   loratadine 10 MG tablet Commonly known as: CLARITIN Take 10 mg by mouth daily. Start day of WBC injection and daily  for 5 days.   NovoLOG FlexPen 100 UNIT/ML FlexPen Generic drug: insulin aspart Inject 8 Units into the skin 2 (two) times daily with breakfast and lunch AND 10 Units daily with supper.   ondansetron 4 MG tablet Commonly known as: ZOFRAN Take 1 tablet (4 mg total) by mouth every 4 (four) hours as needed for nausea.   Ozempic (0.25 or 0.5 MG/DOSE) 2 MG/1.5ML Sopn Generic drug: Semaglutide(0.25 or 0.'5MG'$ /DOS) Inject 0.5 mLs into the skin once a week.   prochlorperazine 10 MG tablet Commonly known as: COMPAZINE Take 1 tablet (10 mg total) by mouth every 6 (six) hours as needed for nausea or vomiting.   Tyler Aas FlexTouch 200 UNIT/ML FlexTouch Pen Generic drug: insulin  degludec Inject 40 Units into the skin daily.   valsartan-hydrochlorothiazide 80-12.5 MG tablet Commonly known as: DIOVAN-HCT Take 1 tablet by mouth daily.         OBJECTIVE:   Vital Signs: BP (!) 142/80   Pulse (!) 105   Ht 5' 4.5" (1.638 m)   Wt 169 lb 3.2 oz (76.7 kg)   SpO2 93%   BMI 28.59 kg/m   Wt Readings from Last 3 Encounters:  09/27/20 169 lb 3.2 oz (76.7 kg)  09/26/20 168 lb 6.4 oz (76.4 kg)  09/08/20 167 lb 0.6 oz (75.8 kg)     Exam: General: Pt appears well and is in NAD  Lungs: Clear with good BS bilat   Heart: RRR   Abdomen: Normoactive bowel sounds, soft, nontender, without masses or organomegaly palpable  Extremities: Trace  pretibial edema.   Neuro: MS is good with appropriate affect, pt is alert and Ox3    DM foot exam: 05/24/2020     The skin of the feet is intact without sores or ulcerations. The pedal pulses are 2+ on right and 2+ on left. The sensation is intact to a screening 5.07, 10 gram monofilament bilaterally    DATA REVIEWED:  Lab Results  Component Value Date   HGBA1C 9.0 (A) 09/27/2020   HGBA1C 9.0 (A) 05/24/2020   HGBA1C 10.8 (A) 02/23/2020   Results for Heather Waller, Heather Waller (MRN [id number]) as of 09/28/2020 20:18  Ref. Range 09/26/2020 00:00 09/27/2020 13:45  Sodium Latest Ref Range: 137 - 147  137   Potassium Latest Ref Range: 3.4 - 5.3  4.2   Chloride Latest Ref Range: 99 - 108  99   CO2 Latest Ref Range: 13 - 22  29 (A)   Glucose Unknown 227   BUN Latest Ref Range: 4 - 21  14   Creatinine Latest Ref Range: 0.5 - 1.1  0.5   Calcium Latest Ref Range: 8.7 - 10.7  9.3   Alkaline Phosphatase Latest Ref Range: 25 - 125  84   Albumin Latest Ref Range: 3.5 - 5.0  4.0   AST Latest Ref Range: 13 - 35  28   ALT Latest Ref Range: 7 - 35  22   Bilirubin, Total Unknown 0.2   WBC Unknown 11.9   RBC Latest Ref Range: 3.87 - 5.11  4.53   Hemoglobin Latest Ref Range: 12.0 - 16.0  13.8   HCT Latest Ref Range: 36 - 46  41   Platelets  Latest Ref Range: 150 - 399  277   NEUT# Unknown 8.21   Hemoglobin A1C Latest Ref Range: 4.0 - 5.6 %  9.0 (A)     ASSESSMENT / PLAN / RECOMMENDATIONS:   1) Type 2 Diabetes Mellitus, Poorly controlled, With out complications -  Most recent A1c of 9.0 %. Goal A1c < 7.0 %.    - Pt continues with hyperglycemia  - I have attempted MULTIPLE times to prescribe Dexcom o either local pharmacy as well as to [facility]  but this has not been productive.  - I am going to prescribed freestyle libre to her local [facility] if costing her $700 . She was provided with pt assistance papers  - She will be provided with two insulin regimens ( one to be used on the days she on Dexamethasome and the other regimen to be used off Dexamethasone  - She is currently on 3 week cycles of chemotherapy requiring 8 mg of dexamethasone for 2 days prior to chemo.  - Will increase Ozempic to 1 mg  - I am unable to provider her with correction scale as she doesn't check glucose during the day , hence will not use it.   MEDICATIONS:  Ozempic 1 mg weekly   OFF DEXAMETHASONE :  Increase Tresiba to 50 units daily  Increase Novolog 12 units with each meal     WHILE ON DEXAMETHASONE: Tresiba 55 units on the day you are on Dexamethasone  Novolog 16 units with each meal     EDUCATION / INSTRUCTIONS: BG monitoring instructions: Patient is instructed to check her blood sugars 3 times a day, before meals . Call [facility] clinic if: BG persistently < 70  I reviewed the Rule of 15 for the treatment of hypoglycemia in detail with the patient. Literature supplied.   2) Diabetic complications:  Eye: Does not have known diabetic retinopathy.  Neuro/ Feet: Does not have known diabetic peripheral neuropathy .  Renal: Patient does not have known baseline CKD. She   is  on an ACEI/ARB at present.     I spent 25 minutes preparing to see the patient by review of recent labs, imaging and procedures, obtaining and  reviewing separately obtained history, communicating with the patient, ordering medications, and documenting clinical information in the EHR including the differential Dx, treatment, and any further evaluation and other management    F/U in 4 months    Signed electronically by: Mack Guise, MD  [facility] Endocrinology  [facility] [place], [place] [address] Phone: [phone] FAX: [phone]   CC: Charlynn Court, NP [place] [address] Phone: [phone]  Fax: [phone]  Return to Endocrinology clinic as below: Future Appointments  Date Time Provider [place]  09/28/2020 10:00 AM [facility] INFUSION CHAIR 6 [facility] None  09/29/2020  2:30 PM [facility] INFUSION CHAIR 1 [facility] None  10/17/2020 11:15 AM [facility]LAB [facility] None  10/17/2020 11:45 AM Lewis, Dequincy A, MD [facility] None  10/19/2020 10:00 AM [facility] INFUSION CHAIR 4 [facility] None  10/20/2020  2:30 PM [facility] INFUSION CHAIR 7 [facility] None

## 2020-09-27 NOTE — Patient Instructions (Addendum)
OFF DEXAMETHASONE :  Ozempic 1 mg once weekly  Increase Tresiba to 50 units daily  Increase Novolog 12 units with each meal     WHILE ON DEXAMETHASONE: Ozempic 1 mg once weekly  Tresiba 55 units on the day you are on Dexamethasone  Novolog 16 units with each meal    HOW TO TREAT LOW BLOOD SUGARS (Blood sugar LESS THAN 70 MG/DL) Please follow the RULE OF 15 for the treatment of hypoglycemia treatment (when your (blood sugars are less than 70 mg/dL)   STEP 1: Take 15 grams of carbohydrates when your blood sugar is low, which includes:  3-4 GLUCOSE TABS  OR 3-4 OZ OF JUICE OR REGULAR SODA OR ONE TUBE OF GLUCOSE GEL    STEP 2: RECHECK blood sugar in 15 MINUTES STEP 3: If your blood sugar is still low at the 15 minute recheck --> then, go back to STEP 1 and treat AGAIN with another 15 grams of carbohydrates.

## 2020-09-28 ENCOUNTER — Inpatient Hospital Stay: Payer: Medicare Other

## 2020-09-28 ENCOUNTER — Other Ambulatory Visit: Payer: Self-pay | Admitting: Hematology and Oncology

## 2020-09-28 VITALS — BP 174/74 | HR 97 | Temp 98.4°F | Resp 18 | Ht 64.5 in | Wt 169.2 lb

## 2020-09-28 DIAGNOSIS — Z170 Estrogen receptor positive status [ER+]: Secondary | ICD-10-CM

## 2020-09-28 DIAGNOSIS — C50411 Malignant neoplasm of upper-outer quadrant of right female breast: Secondary | ICD-10-CM | POA: Diagnosis present

## 2020-09-28 DIAGNOSIS — Z5189 Encounter for other specified aftercare: Secondary | ICD-10-CM | POA: Diagnosis not present

## 2020-09-28 DIAGNOSIS — Z5111 Encounter for antineoplastic chemotherapy: Secondary | ICD-10-CM | POA: Diagnosis present

## 2020-09-28 MED ORDER — TRASTUZUMAB-ANNS CHEMO 150 MG IV SOLR
6.0000 mg/kg | Freq: Once | INTRAVENOUS | Status: AC
  Administered 2020-09-28: 462 mg via INTRAVENOUS
  Filled 2020-09-28: qty 22

## 2020-09-28 MED ORDER — DIPHENHYDRAMINE HCL 25 MG PO CAPS
50.0000 mg | ORAL_CAPSULE | Freq: Once | ORAL | Status: AC
  Administered 2020-09-28: 50 mg via ORAL
  Filled 2020-09-28: qty 2

## 2020-09-28 MED ORDER — SODIUM CHLORIDE 0.9 % IV SOLN: Freq: Once | INTRAVENOUS | Status: AC

## 2020-09-28 MED ORDER — HEPARIN SOD (PORK) LOCK FLUSH 100 UNIT/ML IV SOLN
500.0000 [IU] | Freq: Once | INTRAVENOUS | Status: AC | PRN
  Administered 2020-09-28: 500 [IU]

## 2020-09-28 MED ORDER — SODIUM CHLORIDE 0.9 % IV SOLN
533.4000 mg | Freq: Once | INTRAVENOUS | Status: AC
  Administered 2020-09-28: 530 mg via INTRAVENOUS
  Filled 2020-09-28: qty 53

## 2020-09-28 MED ORDER — PALONOSETRON HCL INJECTION 0.25 MG/5ML
0.2500 mg | Freq: Once | INTRAVENOUS | Status: AC
  Administered 2020-09-28: 0.25 mg via INTRAVENOUS
  Filled 2020-09-28: qty 5

## 2020-09-28 MED ORDER — SODIUM CHLORIDE 0.9% FLUSH
10.0000 mL | INTRAVENOUS | Status: DC | PRN
  Administered 2020-09-28: 10 mL

## 2020-09-28 MED ORDER — SODIUM CHLORIDE 0.9 % IV SOLN
75.0000 mg/m2 | Freq: Once | INTRAVENOUS | Status: AC
  Administered 2020-09-28: 140 mg via INTRAVENOUS
  Filled 2020-09-28: qty 14

## 2020-09-28 MED ORDER — ACETAMINOPHEN 325 MG PO TABS
650.0000 mg | ORAL_TABLET | Freq: Once | ORAL | Status: AC
  Administered 2020-09-28: 650 mg via ORAL
  Filled 2020-09-28: qty 2

## 2020-09-28 MED ORDER — SODIUM CHLORIDE 0.9 % IV SOLN
10.0000 mg | Freq: Once | INTRAVENOUS | Status: AC
  Administered 2020-09-28: 10 mg via INTRAVENOUS
  Filled 2020-09-28: qty 10

## 2020-09-28 MED ORDER — SODIUM CHLORIDE 0.9 % IV SOLN
150.0000 mg | Freq: Once | INTRAVENOUS | Status: AC
  Administered 2020-09-28: 150 mg via INTRAVENOUS
  Filled 2020-09-28: qty 150

## 2020-09-28 NOTE — Progress Notes (Signed)
1337: PT STABLE AT TIME OF DISCHARGE

## 2020-09-28 NOTE — Patient Instructions (Signed)
[facility]  Discharge Instructions: Thank you for choosing [facility] to provide your oncology and hematology care.  If you have a lab appointment with the [facility], please go directly to the [facility] and check in at the registration area.   Wear comfortable clothing and clothing appropriate for easy access to any Portacath or PICC line.   We strive to give you quality time with your provider. You may need to reschedule your appointment if you arrive late (15 or more minutes).  Arriving late affects you and other patients whose appointments are after yours.  Also, if you miss three or more appointments without notifying the office, you may be dismissed from the clinic at the provider's discretion.      For prescription refill requests, have your pharmacy contact our office and allow 72 hours for refills to be completed.    Today you received the following chemotherapy and/or immunotherapy agents Carbolpatin,Docetaxel,Trastuzumab   To help prevent nausea and vomiting after your treatment, we encourage you to take your nausea medication as directed.  BELOW ARE SYMPTOMS THAT SHOULD BE REPORTED IMMEDIATELY: *FEVER GREATER THAN 100.4 F (38 C) OR HIGHER *CHILLS OR SWEATING *NAUSEA AND VOMITING THAT IS NOT CONTROLLED WITH YOUR NAUSEA MEDICATION *UNUSUAL SHORTNESS OF BREATH *UNUSUAL BRUISING OR BLEEDING *URINARY PROBLEMS (pain or burning when urinating, or frequent urination) *BOWEL PROBLEMS (unusual diarrhea, constipation, pain near the anus) TENDERNESS IN MOUTH AND THROAT WITH OR WITHOUT PRESENCE OF ULCERS (sore throat, sores in mouth, or a toothache) UNUSUAL RASH, SWELLING OR PAIN  UNUSUAL VAGINAL DISCHARGE OR ITCHING   Items with * indicate a potential emergency and should be followed up as soon as possible or go to the Emergency Department if any problems should occur.  Please show the CHEMOTHERAPY ALERT CARD or IMMUNOTHERAPY ALERT CARD at  check-in to the Emergency Department and triage nurse.  Should you have questions after your visit or need to cancel or reschedule your appointment, please contact [facility]  Dept: [phone]  and follow the prompts.  Office hours are 8:00 a.m. to 4:30 p.m. Monday - Friday. Please note that voicemails left after 4:00 p.m. may not be returned until the following business day.  We are closed weekends and major holidays. You have access to a nurse at all times for urgent questions. Please call the main number to the clinic Dept: [phone] and follow the prompts.  For any non-urgent questions, you may also contact your provider using MyChart. We now offer e-Visits for anyone 52 and older to request care online for non-urgent symptoms. For details visit [email or web address].   Also download the MyChart app! Go to the app store, search "MyChart", open the app, select [facility], and log in with your MyChart username and password.  Due to Covid, a mask is required upon entering the hospital/clinic. If you do not have a mask, one will be given to you upon arrival. For doctor visits, patients may have 1 support person aged 22 or older with them. For treatment visits, patients cannot have anyone with them due to current Covid guidelines and our immunocompromised population.

## 2020-09-29 ENCOUNTER — Telehealth: Payer: Self-pay | Admitting: Hematology and Oncology

## 2020-09-29 ENCOUNTER — Other Ambulatory Visit: Payer: Self-pay

## 2020-09-29 ENCOUNTER — Inpatient Hospital Stay: Payer: Medicare Other

## 2020-09-29 VITALS — BP 156/74 | HR 96 | Temp 98.1°F | Resp 18 | Ht 64.5 in | Wt 169.0 lb

## 2020-09-29 DIAGNOSIS — Z170 Estrogen receptor positive status [ER+]: Secondary | ICD-10-CM

## 2020-09-29 DIAGNOSIS — Z5111 Encounter for antineoplastic chemotherapy: Secondary | ICD-10-CM | POA: Diagnosis not present

## 2020-09-29 DIAGNOSIS — C50411 Malignant neoplasm of upper-outer quadrant of right female breast: Secondary | ICD-10-CM

## 2020-09-29 MED ORDER — PEGFILGRASTIM-BMEZ 6 MG/0.6ML ~~LOC~~ SOSY
6.0000 mg | PREFILLED_SYRINGE | Freq: Once | SUBCUTANEOUS | Status: AC
  Administered 2020-09-29: 6 mg via SUBCUTANEOUS
  Filled 2020-09-29: qty 0.6

## 2020-09-29 NOTE — Telephone Encounter (Signed)
Patient notified of scheduled 09/29/20 Venous Doppler U/S of Rt Leg checking in at 9:30 am for 10:00 am Appt

## 2020-09-29 NOTE — Patient Instructions (Signed)

## 2020-10-02 ENCOUNTER — Telehealth: Payer: Self-pay

## 2020-10-02 NOTE — Telephone Encounter (Signed)
Melissa,NP, looked up the results of the venous doppler of leg, and the results were negative for blood clot. Pt notified. I offered apologies as well for her having to wait until this morning to get results.

## 2020-10-03 ENCOUNTER — Encounter: Payer: Self-pay | Admitting: Oncology

## 2020-10-09 NOTE — Progress Notes (Signed)
[facility]  [address] [place],  [place]  [address] [phone]  Clinic Day:  10/17/2020  Referring physician: Charlynn Court, NP  This document serves as a record of services personally performed by [name], MD. It was created on their behalf by Curry,Lauren E, a trained medical scribe. The creation of this record is based on the scribe's personal observations and the provider's statements to them.  HISTORY OF PRESENT ILLNESS:  The patient is a 70 y.o. female with stage IA (T1c N0 M0) hormone/Her2 positive right breast cancer, status post a lumpectomy in June 2022.  She comes in today to be evaluated before heading into her 4th cycle of adjuvant TCH chemotherapy.  The patient claims to have tolerated her 3rd cycle of chemotherapy very fairly well.  She has had leg swelling and hand stiffness, which both are related to her Taxotere chemotherapy.  She denies having any particular changes in her right breast or elsewhere which concern her for early disease recurrence.  VITALS:  Blood pressure (!) 167/73, pulse 90, temperature 98.6 F (37 C), resp. rate 16, height 5' 4.5" (1.638 m), weight 173 lb 9.6 oz (78.7 kg), SpO2 95 %.  Wt Readings from Last 3 Encounters:  10/17/20 173 lb 9.6 oz (78.7 kg)  09/29/20 169 lb (76.7 kg)  09/28/20 169 lb 4 oz (76.8 kg)    Body mass index is 29.34 kg/m.  Performance status (ECOG): 0 - Asymptomatic  PHYSICAL EXAM:  Physical Exam Constitutional:      General: She is not in acute distress.    Appearance: Normal appearance. She is normal weight.  HENT:     Head: Normocephalic and atraumatic.  Eyes:     General: No scleral icterus.    Extraocular Movements: Extraocular movements intact.     Conjunctiva/sclera: Conjunctivae normal.     Pupils: Pupils are equal, round, and reactive to light.  Cardiovascular:     Rate and Rhythm: Normal rate and regular rhythm.     Pulses: Normal pulses.     Heart sounds:  Normal heart sounds. No murmur heard.   No friction rub. No gallop.  Pulmonary:     Effort: Pulmonary effort is normal. No respiratory distress.     Breath sounds: Normal breath sounds.  Abdominal:     General: Bowel sounds are normal. There is no distension.     Palpations: Abdomen is soft. There is no hepatomegaly, splenomegaly or mass.     Tenderness: There is no abdominal tenderness.  Musculoskeletal:        General: Normal range of motion.     Cervical back: Normal range of motion and neck supple.     Right lower leg: No edema.     Left lower leg: No edema.  Lymphadenopathy:     Cervical: No cervical adenopathy.  Skin:    General: Skin is warm and dry.  Neurological:     General: No focal deficit present.     Mental Status: She is alert and oriented to person, place, and time. Mental status is at baseline.  Psychiatric:        Mood and Affect: Mood normal.        Behavior: Behavior normal.        Thought Content: Thought content normal.        Judgment: Judgment normal.   LABS:     ASSESSMENT & PLAN:  Assessment/Plan:  A  70 y.o. female with stage IA (  T1c N0 M0) hormone/Her2 positive breast cancer. She will proceed with her 4th cycle of TCH this week.  She will continue to receive Neulasta with each cycle of chemotherapy to prevent severe neutropenia from delaying successive cycles of treatment.  I will see her back in 3 weeks before she heads into her 5th cycle of adjuvant TCH. The patient understands all the plans discussed today and is in agreement with them.    I, [name], am acting as scribe for [name], MD    I have reviewed this report as typed by the medical scribe, and it is complete and accurate.  Dequincy Macarthur Critchley, MD

## 2020-10-13 ENCOUNTER — Telehealth: Payer: Self-pay

## 2020-10-13 NOTE — Telephone Encounter (Signed)
Patient called to report new swelling and redness to both extremities. Stated she was seen in the ED for possible DVT last week. Reports that symptoms ae worsening. Patient in ED waiting area when contacted. Advised to remain for medical assessment if concerned about signs and symptoms

## 2020-10-17 ENCOUNTER — Other Ambulatory Visit: Payer: Self-pay | Admitting: Hematology and Oncology

## 2020-10-17 ENCOUNTER — Telehealth: Payer: Self-pay

## 2020-10-17 ENCOUNTER — Inpatient Hospital Stay: Payer: Medicare Other | Attending: Oncology | Admitting: Oncology

## 2020-10-17 ENCOUNTER — Telehealth: Payer: Self-pay | Admitting: Oncology

## 2020-10-17 ENCOUNTER — Inpatient Hospital Stay: Payer: Medicare Other

## 2020-10-17 VITALS — BP 167/73 | HR 90 | Temp 98.6°F | Resp 16 | Ht 64.5 in | Wt 173.6 lb

## 2020-10-17 DIAGNOSIS — C50411 Malignant neoplasm of upper-outer quadrant of right female breast: Secondary | ICD-10-CM | POA: Insufficient documentation

## 2020-10-17 DIAGNOSIS — Z5111 Encounter for antineoplastic chemotherapy: Secondary | ICD-10-CM | POA: Insufficient documentation

## 2020-10-17 DIAGNOSIS — Z170 Estrogen receptor positive status [ER+]: Secondary | ICD-10-CM | POA: Insufficient documentation

## 2020-10-17 DIAGNOSIS — G620 Drug-induced polyneuropathy: Secondary | ICD-10-CM | POA: Insufficient documentation

## 2020-10-17 DIAGNOSIS — M25549 Pain in joints of unspecified hand: Secondary | ICD-10-CM | POA: Insufficient documentation

## 2020-10-17 DIAGNOSIS — R600 Localized edema: Secondary | ICD-10-CM | POA: Insufficient documentation

## 2020-10-17 DIAGNOSIS — Z5189 Encounter for other specified aftercare: Secondary | ICD-10-CM | POA: Insufficient documentation

## 2020-10-17 LAB — BASIC METABOLIC PANEL
BUN: 14 (ref 4–21)
CO2: 25 — AB (ref 13–22)
Chloride: 102 (ref 99–108)
Creatinine: 0.5 (ref 0.5–1.1)
Glucose: 161
Potassium: 4 (ref 3.4–5.3)
Sodium: 134 — AB (ref 137–147)

## 2020-10-17 LAB — HEPATIC FUNCTION PANEL
ALT: 15 (ref 7–35)
AST: 27 (ref 13–35)
Alkaline Phosphatase: 55 (ref 25–125)
Bilirubin, Total: 0.3

## 2020-10-17 LAB — CBC AND DIFFERENTIAL
HCT: 37 (ref 36–46)
Hemoglobin: 12.4 (ref 12.0–16.0)
Neutrophils Absolute: 7.91
Platelets: 268 (ref 150–399)
WBC: 11.8

## 2020-10-17 LAB — CBC: RBC: 4.11 (ref 3.87–5.11)

## 2020-10-17 LAB — COMPREHENSIVE METABOLIC PANEL
Albumin: 3.4 — AB (ref 3.5–5.0)
Calcium: 8.9 (ref 8.7–10.7)

## 2020-10-17 LAB — PROTEIN, TOTAL: Total Protein: 6.2 g/dL — AB (ref 6.3–8.2)

## 2020-10-17 LAB — CEA: MCV: 91 (ref 81–99)

## 2020-10-17 LAB — CORRECTED CALCIUM (CC13): Calcium, Corrected: 9.5

## 2020-10-17 NOTE — Telephone Encounter (Signed)
Per 9/6 LOS next appt scheduled and confirmed by patient

## 2020-10-17 NOTE — Telephone Encounter (Signed)
I'm just documenting this in telephone encounter to get it documented in pt's chart. I did not work on 10/13/2020. Deanda Ruddell,RN.   Message Received: 4 days ago Maxwell Marion, RN  Dairl Ponder, RN [facility] Riis called with increased leg swelling and tenderness on 9/2- Wanted to know if she should go to the ED. By the time I called her, she was already in the ED waiting room and I told her if she had concerns to remain and be seen. I saved the message on the triage line but it has been taken care of.

## 2020-10-18 MED FILL — Docetaxel Soln for IV Infusion 160 MG/16ML: INTRAVENOUS | Qty: 14 | Status: AC

## 2020-10-18 MED FILL — Fosaprepitant Dimeglumine For IV Infusion 150 MG (Base Eq): INTRAVENOUS | Qty: 5 | Status: AC

## 2020-10-18 MED FILL — Dexamethasone Sodium Phosphate Inj 100 MG/10ML: INTRAMUSCULAR | Qty: 1 | Status: AC

## 2020-10-18 MED FILL — Trastuzumab-anns For IV Soln 150 MG: INTRAVENOUS | Qty: 22 | Status: AC

## 2020-10-18 MED FILL — Carboplatin IV Soln 600 MG/60ML: INTRAVENOUS | Qty: 53 | Status: AC

## 2020-10-19 ENCOUNTER — Inpatient Hospital Stay: Payer: Medicare Other

## 2020-10-19 ENCOUNTER — Other Ambulatory Visit: Payer: Self-pay

## 2020-10-19 VITALS — BP 148/69 | HR 102 | Temp 97.5°F | Resp 18 | Ht 64.5 in | Wt 174.5 lb

## 2020-10-19 DIAGNOSIS — Z170 Estrogen receptor positive status [ER+]: Secondary | ICD-10-CM

## 2020-10-19 DIAGNOSIS — Z5189 Encounter for other specified aftercare: Secondary | ICD-10-CM | POA: Diagnosis not present

## 2020-10-19 DIAGNOSIS — Z5111 Encounter for antineoplastic chemotherapy: Secondary | ICD-10-CM | POA: Diagnosis present

## 2020-10-19 DIAGNOSIS — R600 Localized edema: Secondary | ICD-10-CM | POA: Diagnosis not present

## 2020-10-19 DIAGNOSIS — C50411 Malignant neoplasm of upper-outer quadrant of right female breast: Secondary | ICD-10-CM

## 2020-10-19 DIAGNOSIS — M25549 Pain in joints of unspecified hand: Secondary | ICD-10-CM | POA: Diagnosis not present

## 2020-10-19 DIAGNOSIS — G620 Drug-induced polyneuropathy: Secondary | ICD-10-CM | POA: Diagnosis not present

## 2020-10-19 MED ORDER — SODIUM CHLORIDE 0.9 % IV SOLN
530.0000 mg | Freq: Once | INTRAVENOUS | Status: AC
  Administered 2020-10-19: 530 mg via INTRAVENOUS
  Filled 2020-10-19: qty 53

## 2020-10-19 MED ORDER — SODIUM CHLORIDE 0.9% FLUSH
10.0000 mL | INTRAVENOUS | Status: DC | PRN
  Administered 2020-10-19: 10 mL

## 2020-10-19 MED ORDER — DIPHENHYDRAMINE HCL 25 MG PO CAPS
50.0000 mg | ORAL_CAPSULE | Freq: Once | ORAL | Status: AC
  Administered 2020-10-19: 50 mg via ORAL
  Filled 2020-10-19: qty 2

## 2020-10-19 MED ORDER — SODIUM CHLORIDE 0.9 % IV SOLN: Freq: Once | INTRAVENOUS | Status: AC

## 2020-10-19 MED ORDER — TRASTUZUMAB-ANNS CHEMO 150 MG IV SOLR
6.0000 mg/kg | Freq: Once | INTRAVENOUS | Status: AC
  Administered 2020-10-19: 462 mg via INTRAVENOUS
  Filled 2020-10-19: qty 22

## 2020-10-19 MED ORDER — SODIUM CHLORIDE 0.9 % IV SOLN
75.0000 mg/m2 | Freq: Once | INTRAVENOUS | Status: AC
  Administered 2020-10-19: 140 mg via INTRAVENOUS
  Filled 2020-10-19: qty 14

## 2020-10-19 MED ORDER — SODIUM CHLORIDE 0.9 % IV SOLN
150.0000 mg | Freq: Once | INTRAVENOUS | Status: AC
  Administered 2020-10-19: 150 mg via INTRAVENOUS
  Filled 2020-10-19: qty 150

## 2020-10-19 MED ORDER — HEPARIN SOD (PORK) LOCK FLUSH 100 UNIT/ML IV SOLN
500.0000 [IU] | Freq: Once | INTRAVENOUS | Status: AC | PRN
  Administered 2020-10-19: 500 [IU]

## 2020-10-19 MED ORDER — SODIUM CHLORIDE 0.9 % IV SOLN
10.0000 mg | Freq: Once | INTRAVENOUS | Status: AC
  Administered 2020-10-19: 10 mg via INTRAVENOUS
  Filled 2020-10-19: qty 10

## 2020-10-19 MED ORDER — PALONOSETRON HCL INJECTION 0.25 MG/5ML
0.2500 mg | Freq: Once | INTRAVENOUS | Status: AC
  Administered 2020-10-19: 0.25 mg via INTRAVENOUS
  Filled 2020-10-19: qty 5

## 2020-10-19 MED ORDER — ACETAMINOPHEN 325 MG PO TABS
650.0000 mg | ORAL_TABLET | Freq: Once | ORAL | Status: AC
  Administered 2020-10-19: 650 mg via ORAL
  Filled 2020-10-19: qty 2

## 2020-10-19 NOTE — Patient Instructions (Signed)
[facility]  Discharge Instructions: Thank you for choosing [facility] to provide your oncology and hematology care.  If you have a lab appointment with the [facility], please go directly to the [facility] and check in at the registration area.   Wear comfortable clothing and clothing appropriate for easy access to any Portacath or PICC line.   We strive to give you quality time with your provider. You may need to reschedule your appointment if you arrive late (15 or more minutes).  Arriving late affects you and other patients whose appointments are after yours.  Also, if you miss three or more appointments without notifying the office, you may be dismissed from the clinic at the provider's discretion.      For prescription refill requests, have your pharmacy contact our office and allow 72 hours for refills to be completed.    Today you received the following chemotherapy and/or immunotherapy agents Docetaxel,Trastuzumab,Carboplatin    To help prevent nausea and vomiting after your treatment, we encourage you to take your nausea medication as directed.  BELOW ARE SYMPTOMS THAT SHOULD BE REPORTED IMMEDIATELY: *FEVER GREATER THAN 100.4 F (38 C) OR HIGHER *CHILLS OR SWEATING *NAUSEA AND VOMITING THAT IS NOT CONTROLLED WITH YOUR NAUSEA MEDICATION *UNUSUAL SHORTNESS OF BREATH *UNUSUAL BRUISING OR BLEEDING *URINARY PROBLEMS (pain or burning when urinating, or frequent urination) *BOWEL PROBLEMS (unusual diarrhea, constipation, pain near the anus) TENDERNESS IN MOUTH AND THROAT WITH OR WITHOUT PRESENCE OF ULCERS (sore throat, sores in mouth, or a toothache) UNUSUAL RASH, SWELLING OR PAIN  UNUSUAL VAGINAL DISCHARGE OR ITCHING   Items with * indicate a potential emergency and should be followed up as soon as possible or go to the Emergency Department if any problems should occur.  Please show the CHEMOTHERAPY ALERT CARD or IMMUNOTHERAPY ALERT CARD  at check-in to the Emergency Department and triage nurse.  Should you have questions after your visit or need to cancel or reschedule your appointment, please contact [facility]  Dept: [phone]  and follow the prompts.  Office hours are 8:00 a.m. to 4:30 p.m. Monday - Friday. Please note that voicemails left after 4:00 p.m. may not be returned until the following business day.  We are closed weekends and major holidays. You have access to a nurse at all times for urgent questions. Please call the main number to the clinic Dept: [phone] and follow the prompts.  For any non-urgent questions, you may also contact your provider using MyChart. We now offer e-Visits for anyone 76 and older to request care online for non-urgent symptoms. For details visit [email or web address].   Also download the MyChart app! Go to the app store, search "MyChart", open the app, select [facility], and log in with your MyChart username and password.  Due to Covid, a mask is required upon entering the hospital/clinic. If you do not have a mask, one will be given to you upon arrival. For doctor visits, patients may have 1 support person aged 40 or older with them. For treatment visits, patients cannot have anyone with them due to current Covid guidelines and our immunocompromised population.

## 2020-10-19 NOTE — Progress Notes (Signed)
1418: PT STABLE AT TIME OF DISCHARGE

## 2020-10-20 ENCOUNTER — Other Ambulatory Visit: Payer: Self-pay

## 2020-10-20 ENCOUNTER — Inpatient Hospital Stay: Payer: Medicare Other

## 2020-10-20 VITALS — BP 148/71 | HR 87 | Temp 97.8°F | Resp 18 | Ht 64.5 in | Wt 176.0 lb

## 2020-10-20 DIAGNOSIS — Z5111 Encounter for antineoplastic chemotherapy: Secondary | ICD-10-CM | POA: Diagnosis not present

## 2020-10-20 DIAGNOSIS — C50411 Malignant neoplasm of upper-outer quadrant of right female breast: Secondary | ICD-10-CM

## 2020-10-20 MED ORDER — PEGFILGRASTIM-BMEZ 6 MG/0.6ML ~~LOC~~ SOSY
6.0000 mg | PREFILLED_SYRINGE | Freq: Once | SUBCUTANEOUS | Status: AC
  Administered 2020-10-20: 6 mg via SUBCUTANEOUS
  Filled 2020-10-20: qty 0.6

## 2020-10-20 NOTE — Patient Instructions (Signed)

## 2020-10-24 ENCOUNTER — Encounter: Payer: Self-pay | Admitting: Oncology

## 2020-10-31 NOTE — Progress Notes (Signed)
[facility]  [address] [place],  [place]  [address] [phone]  Clinic Day:  11/07/2020  Referring physician: Charlynn Court, NP  This document serves as a record of services personally performed by [name], MD. It was created on their behalf by Curry,Lauren E, a trained medical scribe. The creation of this record is based on the scribe's personal observations and the provider's statements to them.  HISTORY OF PRESENT ILLNESS:  The patient is a 70 y.o. female with stage IA (T1c N0 M0) hormone/Her2 positive right breast cancer, status post a lumpectomy in June 2022.  She comes in today to be evaluated before heading into her 5th cycle of adjuvant TCH chemotherapy.  The patient claims to have tolerated her 4th cycle of chemotherapy very fairly well.  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  She has also noticed increased swelling and stiffness in her legs, all of which are related to her Taxotere chemotherapy.  She denies having any particular changes in her right breast or elsewhere which concern her for early disease recurrence.  VITALS:  Blood pressure 140/63, pulse 96, temperature 98.4 F (36.9 C), resp. rate 16, height 5' 4.5" (1.638 m), weight 178 lb 1.6 oz (80.8 kg), SpO2 96 %.  Wt Readings from Last 3 Encounters:  11/07/20 178 lb 1.6 oz (80.8 kg)  10/20/20 176 lb 0.6 oz (79.9 kg)  10/19/20 174 lb 8 oz (79.2 kg)    Body mass index is 30.1 kg/m.  Performance status (ECOG): 0 - Asymptomatic  PHYSICAL EXAM:  Physical Exam Constitutional:      General: She is not in acute distress.    Appearance: Normal appearance. She is normal weight.  HENT:     Head: Normocephalic and atraumatic.  Eyes:     General: No scleral icterus.    Extraocular Movements: Extraocular movements intact.     Conjunctiva/sclera: Conjunctivae normal.     Pupils: Pupils are equal, round, and reactive to light.   Cardiovascular:     Rate and Rhythm: Normal rate and regular rhythm.     Pulses: Normal pulses.     Heart sounds: Normal heart sounds. No murmur heard.   No friction rub. No gallop.  Pulmonary:     Effort: Pulmonary effort is normal. No respiratory distress.     Breath sounds: Normal breath sounds.  Abdominal:     General: Bowel sounds are normal. There is no distension.     Palpations: Abdomen is soft. There is no hepatomegaly, splenomegaly or mass.     Tenderness: There is no abdominal tenderness.  Musculoskeletal:        General: Normal range of motion.     Cervical back: Normal range of motion and neck supple.     Right lower leg: No edema.     Left lower leg: No edema.  Lymphadenopathy:     Cervical: No cervical adenopathy.  Skin:    General: Skin is warm and dry.  Neurological:     General: No focal deficit present.     Mental Status: She is alert and oriented to person, place, and time. Mental status is at baseline.  Psychiatric:        Mood and Affect: Mood normal.        Behavior: Behavior normal.        Thought Content: Thought content normal.        Judgment: Judgment normal.   LABS:  Ref. Range 11/07/2020 00:00  Sodium Latest Ref Range: 137 - 147  137  Potassium Latest Ref Range: 3.4 - 5.3  4.0  Chloride Latest Ref Range: 99 - 108  103  CO2 Latest Ref Range: 13 - 22  28 (A)  Glucose Unknown 165  BUN Latest Ref Range: 4 - 21  16  Creatinine Latest Ref Range: 0.5 - 1.1  0.6  Calcium Latest Ref Range: 8.7 - 10.7  8.5 (A)  Alkaline Phosphatase Latest Ref Range: 25 - 125  32  Albumin Latest Ref Range: 3.5 - 5.0  3.4 (A)  AST Latest Ref Range: 13 - 35  21  ALT Latest Ref Range: 7 - 35  13  Total Protein Latest Ref Range: 6.3 - 8.2 g/dL 5.9 (A)  Bilirubin, Total Unknown 0.2  WBC Unknown 11.5  RBC Latest Ref Range: 3.87 - 5.11  3.93  Hemoglobin Latest Ref Range: 12.0 - 16.0  12.1  HCT Latest Ref Range: 36 - 46  37  MCV Latest Ref Range: 80 - 94  93   Platelets Latest Ref Range: 150 - 399  246  NEUT# Unknown 7.82   ASSESSMENT & PLAN:  Assessment/Plan:  A  70 y.o. female with stage IA (T1c N0 M0) hormone/Her2 positive breast cancer. She will proceed with her 5th cycle of TCH this week.  However, based upon her increased extremity neuropathy and stiffness, her Taxotere will be decreased by 25%.  She will continue to receive Neulasta with each cycle of chemotherapy to prevent severe neutropenia from delaying successive cycles of treatment.  I will see her back in 3 weeks before she heads into her 6th and final cycle of adjuvant TCH. The patient understands all the plans discussed today and is in agreement with them.    I, [name], am acting as scribe for [name], MD    I have reviewed this report as typed by the medical scribe, and it is complete and accurate.  Dequincy Macarthur Critchley, MD

## 2020-11-07 ENCOUNTER — Other Ambulatory Visit: Payer: Self-pay | Admitting: Hematology and Oncology

## 2020-11-07 ENCOUNTER — Telehealth: Payer: Self-pay | Admitting: Oncology

## 2020-11-07 ENCOUNTER — Inpatient Hospital Stay: Payer: Medicare Other

## 2020-11-07 ENCOUNTER — Inpatient Hospital Stay (INDEPENDENT_AMBULATORY_CARE_PROVIDER_SITE_OTHER): Payer: Medicare Other | Admitting: Oncology

## 2020-11-07 VITALS — BP 140/63 | HR 96 | Temp 98.4°F | Resp 16 | Ht 64.5 in | Wt 178.1 lb

## 2020-11-07 DIAGNOSIS — C50411 Malignant neoplasm of upper-outer quadrant of right female breast: Secondary | ICD-10-CM | POA: Diagnosis not present

## 2020-11-07 DIAGNOSIS — Z170 Estrogen receptor positive status [ER+]: Secondary | ICD-10-CM

## 2020-11-07 DIAGNOSIS — Z171 Estrogen receptor negative status [ER-]: Secondary | ICD-10-CM | POA: Diagnosis not present

## 2020-11-07 LAB — CBC
MCV: 93 (ref 80–94)
RBC: 3.93 (ref 3.87–5.11)

## 2020-11-07 LAB — COMPREHENSIVE METABOLIC PANEL
Albumin: 3.4 — AB (ref 3.5–5.0)
Calcium: 8.5 — AB (ref 8.7–10.7)

## 2020-11-07 LAB — HEPATIC FUNCTION PANEL
ALT: 13 (ref 7–35)
AST: 21 (ref 13–35)
Alkaline Phosphatase: 32 (ref 25–125)
Bilirubin, Total: 0.2

## 2020-11-07 LAB — BASIC METABOLIC PANEL
BUN: 16 (ref 4–21)
CO2: 28 — AB (ref 13–22)
Chloride: 103 (ref 99–108)
Creatinine: 0.6 (ref 0.5–1.1)
Glucose: 165
Potassium: 4 (ref 3.4–5.3)
Sodium: 137 (ref 137–147)

## 2020-11-07 LAB — CBC AND DIFFERENTIAL
HCT: 37 (ref 36–46)
Hemoglobin: 12.1 (ref 12.0–16.0)
Neutrophils Absolute: 7.82
Platelets: 246 (ref 150–399)
WBC: 11.5

## 2020-11-07 LAB — CORRECTED CALCIUM (CC13): MCV: 9.1 — AB (ref 76–111)

## 2020-11-07 LAB — PROTEIN, TOTAL: Total Protein: 5.9 g/dL — AB (ref 6.3–8.2)

## 2020-11-07 NOTE — Progress Notes (Signed)
Decrease dose of docetaxel by 25% due to neuropathy symptoms per Dr. Bobby Rumpf.

## 2020-11-07 NOTE — Telephone Encounter (Signed)
Per 9/27 LOS next appt scheduled and given to patient

## 2020-11-08 MED FILL — Trastuzumab-anns For IV Soln 150 MG: INTRAVENOUS | Qty: 22 | Status: AC

## 2020-11-08 MED FILL — Docetaxel Soln for IV Infusion 160 MG/16ML: INTRAVENOUS | Qty: 11 | Status: AC

## 2020-11-08 MED FILL — Carboplatin IV Soln 600 MG/60ML: INTRAVENOUS | Qty: 53 | Status: AC

## 2020-11-08 MED FILL — Dexamethasone Sodium Phosphate Inj 100 MG/10ML: INTRAMUSCULAR | Qty: 1 | Status: AC

## 2020-11-09 ENCOUNTER — Encounter: Payer: Self-pay | Admitting: Oncology

## 2020-11-09 ENCOUNTER — Inpatient Hospital Stay: Payer: Medicare Other

## 2020-11-09 VITALS — BP 153/74 | HR 99 | Temp 98.2°F | Resp 18 | Ht 64.5 in | Wt 176.0 lb

## 2020-11-09 DIAGNOSIS — C50411 Malignant neoplasm of upper-outer quadrant of right female breast: Secondary | ICD-10-CM

## 2020-11-09 DIAGNOSIS — Z171 Estrogen receptor negative status [ER-]: Secondary | ICD-10-CM

## 2020-11-09 DIAGNOSIS — T50915D Adverse effect of multiple unspecified drugs, medicaments and biological substances, subsequent encounter: Secondary | ICD-10-CM

## 2020-11-09 DIAGNOSIS — Z5111 Encounter for antineoplastic chemotherapy: Secondary | ICD-10-CM | POA: Diagnosis not present

## 2020-11-09 MED ORDER — SODIUM CHLORIDE 0.9% FLUSH
10.0000 mL | INTRAVENOUS | Status: DC | PRN
  Administered 2020-11-09: 10 mL

## 2020-11-09 MED ORDER — SODIUM CHLORIDE 0.9 % IV SOLN
150.0000 mg | Freq: Once | INTRAVENOUS | Status: AC
  Administered 2020-11-09: 150 mg via INTRAVENOUS
  Filled 2020-11-09: qty 5

## 2020-11-09 MED ORDER — TRASTUZUMAB-ANNS CHEMO 150 MG IV SOLR
6.0000 mg/kg | Freq: Once | INTRAVENOUS | Status: AC
  Administered 2020-11-09: 462 mg via INTRAVENOUS
  Filled 2020-11-09: qty 22

## 2020-11-09 MED ORDER — DEXAMETHASONE SODIUM PHOSPHATE 100 MG/10ML IJ SOLN
10.0000 mg | Freq: Once | INTRAMUSCULAR | Status: AC
  Administered 2020-11-09: 10 mg via INTRAVENOUS
  Filled 2020-11-09: qty 10

## 2020-11-09 MED ORDER — SODIUM CHLORIDE 0.9 % IV SOLN
56.2500 mg/m2 | Freq: Once | INTRAVENOUS | Status: AC
  Administered 2020-11-09: 110 mg via INTRAVENOUS
  Filled 2020-11-09: qty 11

## 2020-11-09 MED ORDER — DIPHENHYDRAMINE HCL 25 MG PO CAPS
50.0000 mg | ORAL_CAPSULE | Freq: Once | ORAL | Status: AC
  Administered 2020-11-09: 50 mg via ORAL
  Filled 2020-11-09: qty 2

## 2020-11-09 MED ORDER — SODIUM CHLORIDE 0.9 % IV SOLN
533.4000 mg | Freq: Once | INTRAVENOUS | Status: AC
  Administered 2020-11-09: 530 mg via INTRAVENOUS
  Filled 2020-11-09: qty 53

## 2020-11-09 MED ORDER — SODIUM CHLORIDE 0.9 % IV SOLN: Freq: Once | INTRAVENOUS | Status: AC

## 2020-11-09 MED ORDER — HEPARIN SOD (PORK) LOCK FLUSH 100 UNIT/ML IV SOLN
500.0000 [IU] | Freq: Once | INTRAVENOUS | Status: AC | PRN
  Administered 2020-11-09: 500 [IU]

## 2020-11-09 MED ORDER — ACETAMINOPHEN 325 MG PO TABS
650.0000 mg | ORAL_TABLET | Freq: Once | ORAL | Status: AC
  Administered 2020-11-09: 650 mg via ORAL
  Filled 2020-11-09: qty 2

## 2020-11-09 MED ORDER — PALONOSETRON HCL INJECTION 0.25 MG/5ML
0.2500 mg | Freq: Once | INTRAVENOUS | Status: AC
  Administered 2020-11-09: 0.25 mg via INTRAVENOUS
  Filled 2020-11-09: qty 5

## 2020-11-09 MED ORDER — PEGFILGRASTIM 6 MG/0.6ML ~~LOC~~ PSKT
6.0000 mg | PREFILLED_SYRINGE | Freq: Once | SUBCUTANEOUS | Status: AC
  Administered 2020-11-09: 6 mg via SUBCUTANEOUS
  Filled 2020-11-09: qty 0.6

## 2020-11-09 NOTE — Patient Instructions (Addendum)
Heather Waller  Discharge Instructions: Thank you for choosing [facility] to provide your oncology and hematology care.  If you have a lab appointment with the [facility], please go directly to the [facility] and check in at the registration area.   Wear comfortable clothing and clothing appropriate for easy access to any Portacath or PICC line.   We strive to give you quality time with your provider. You may need to reschedule your appointment if you arrive late (15 or more minutes).  Arriving late affects you and other patients whose appointments are after yours.  Also, if you miss three or more appointments without notifying the office, you may be dismissed from the clinic at the provider's discretion.      For prescription refill requests, have your pharmacy contact our office and allow 72 hours for refills to be completed.    Today you received the following chemotherapy and/or immunotherapy agents docetaxel, carbo, trastuzumab   To help prevent nausea and vomiting after your treatment, we encourage you to take your nausea medication as directed.  BELOW ARE SYMPTOMS THAT SHOULD BE REPORTED IMMEDIATELY: *FEVER GREATER THAN 100.4 F (38 C) OR HIGHER *CHILLS OR SWEATING *NAUSEA AND VOMITING THAT IS NOT CONTROLLED WITH YOUR NAUSEA MEDICATION *UNUSUAL SHORTNESS OF BREATH *UNUSUAL BRUISING OR BLEEDING *URINARY PROBLEMS (pain or burning when urinating, or frequent urination) *BOWEL PROBLEMS (unusual diarrhea, constipation, pain near the anus) TENDERNESS IN MOUTH AND THROAT WITH OR WITHOUT PRESENCE OF ULCERS (sore throat, sores in mouth, or a toothache) UNUSUAL RASH, SWELLING OR PAIN  UNUSUAL VAGINAL DISCHARGE OR ITCHING   Items with * indicate a potential emergency and should be followed up as soon as possible or go to the Emergency Department if any problems should occur.  Please show the CHEMOTHERAPY ALERT CARD or IMMUNOTHERAPY ALERT CARD at  check-in to the Emergency Department and triage nurse.  Should you have questions after your visit or need to cancel or reschedule your appointment, please contact [facility]  Dept: [phone]  and follow the prompts.  Office hours are 8:00 a.m. to 4:30 p.m. Monday - Friday. Please note that voicemails left after 4:00 p.m. may not be returned until the following business day.  We are closed weekends and major holidays. You have access to a nurse at all times for urgent questions. Please call the main number to the clinic Dept: [phone] and follow the prompts.  For any non-urgent questions, you may also contact your provider using MyChart. We now offer e-Visits for anyone 58 and older to request care online for non-urgent symptoms. For details visit [email or web address].   Also download the MyChart app! Go to the app store, search "MyChart", open the app, select [facility], and log in with your MyChart username and password.  Due to Covid, a mask is required upon entering the hospital/clinic. If you do not have a mask, one will be given to you upon arrival. For doctor visits, patients may have 1 support person aged 39 or older with them. For treatment visits, patients cannot have anyone with them due to current Covid guidelines and our immunocompromised population.   Docetaxel injection What is this medication? DOCETAXEL (doe se TAX el) is a chemotherapy drug. It targets fast dividing cells, like cancer cells, and causes these cells to die. This medicine is used to treat many types of cancers like breast cancer, certain stomach cancers, head and neck cancer, lung cancer, and  prostate cancer. This medicine may be used for other purposes; ask your health care provider or pharmacist if you have questions. COMMON BRAND NAME(S): Docefrez, Taxotere What should I tell my care team before I take this medication? They need to know if you have any of these  conditions: infection (especially a virus infection such as chickenpox, cold sores, or herpes) liver disease low blood counts, like low white cell, platelet, or red cell counts an unusual or allergic reaction to docetaxel, polysorbate 80, other chemotherapy agents, other medicines, foods, dyes, or preservatives pregnant or trying to get pregnant breast-feeding How should I use this medication? This drug is given as an infusion into a vein. It is administered in a hospital or clinic by a specially trained health care professional. Talk to your pediatrician regarding the use of this medicine in children. Special care may be needed. Overdosage: If you think you have taken too much of this medicine contact a poison control center or emergency room at once. NOTE: This medicine is only for you. Do not share this medicine with others. What if I miss a dose? It is important not to miss your dose. Call your doctor or health care professional if you are unable to keep an appointment. What may interact with this medication? Do not take this medicine with any of the following medications: live virus vaccines This medicine may also interact with the following medications: aprepitant certain antibiotics like erythromycin or clarithromycin certain antivirals for HIV or hepatitis certain medicines for fungal infections like fluconazole, itraconazole, ketoconazole, posaconazole, or voriconazole cimetidine ciprofloxacin conivaptan cyclosporine dronedarone fluvoxamine grapefruit juice imatinib verapamil This list may not describe all possible interactions. Give your health care provider a list of all the medicines, herbs, non-prescription drugs, or dietary supplements you use. Also tell them if you smoke, drink alcohol, or use illegal drugs. Some items may interact with your medicine. What should I watch for while using this medication? Your condition will be monitored carefully while you are receiving  this medicine. You will need important blood work done while you are taking this medicine. Call your doctor or health care professional for advice if you get a fever, chills or sore throat, or other symptoms of a cold or flu. Do not treat yourself. This drug decreases your body's ability to fight infections. Try to avoid being around people who are sick. Some products may contain alcohol. Ask your health care professional if this medicine contains alcohol. Be sure to tell all health care professionals you are taking this medicine. Certain medicines, like metronidazole and disulfiram, can cause an unpleasant reaction when taken with alcohol. The reaction includes flushing, headache, nausea, vomiting, sweating, and increased thirst. The reaction can last from 30 minutes to several hours. You may get drowsy or dizzy. Do not drive, use machinery, or do anything that needs mental alertness until you know how this medicine affects you. Do not stand or sit up quickly, especially if you are an older patient. This reduces the risk of dizzy or fainting spells. Alcohol may interfere with the effect of this medicine. Talk to your health care professional about your risk of cancer. You may be more at risk for certain types of cancer if you take this medicine. Do not become pregnant while taking this medicine or for 6 months after stopping it. Women should inform their doctor if they wish to become pregnant or think they might be pregnant. There is a potential for serious side effects to an unborn child.  Talk to your health care professional or pharmacist for more information. Do not breast-feed an infant while taking this medicine or for 1 week after stopping it. Males who get this medicine must use a condom during sex with females who can get pregnant. If you get a woman pregnant, the baby could have birth defects. The baby could die before they are born. You will need to continue wearing a condom for 3 months after  stopping the medicine. Tell your health care provider right away if your partner becomes pregnant while you are taking this medicine. This may interfere with the ability to father a child. You should talk to your doctor or health care professional if you are concerned about your fertility. What side effects may I notice from receiving this medication? Side effects that you should report to your doctor or health care professional as soon as possible: allergic reactions like skin rash, itching or hives, swelling of the face, lips, or tongue blurred vision breathing problems changes in vision low blood counts - This drug may decrease the number of white blood cells, red blood cells and platelets. You may be at increased risk for infections and bleeding. nausea and vomiting pain, redness or irritation at site where injected pain, tingling, numbness in the hands or feet redness, blistering, peeling, or loosening of the skin, including inside the mouth signs of decreased platelets or bleeding - bruising, pinpoint red spots on the skin, black, tarry stools, nosebleeds signs of decreased red blood cells - unusually weak or tired, fainting spells, lightheadedness signs of infection - fever or chills, cough, sore throat, pain or difficulty passing urine swelling of the ankle, feet, hands Side effects that usually do not require medical attention (report to your doctor or health care professional if they continue or are bothersome): constipation diarrhea fingernail or toenail changes hair loss loss of appetite mouth sores muscle pain This list may not describe all possible side effects. Call your doctor for medical advice about side effects. You may report side effects to FDA at 1-800-FDA-1088. Where should I keep my medication? This drug is given in a hospital or clinic and will not be stored at home. NOTE: This sheet is a summary. It may not cover all possible information. If you have questions  about this medicine, talk to your doctor, pharmacist, or health care provider.  2022 Elsevier/Gold Standard (2018-12-28 19:50:31) Carboplatin injection What is this medication? CARBOPLATIN (KAR boe pla tin) is a chemotherapy drug. It targets fast dividing cells, like cancer cells, and causes these cells to die. This medicine is used to treat ovarian cancer and many other cancers. This medicine may be used for other purposes; ask your health care provider or pharmacist if you have questions. COMMON BRAND NAME(S): Paraplatin What should I tell my care team before I take this medication? They need to know if you have any of these conditions: blood disorders hearing problems kidney disease recent or ongoing radiation therapy an unusual or allergic reaction to carboplatin, cisplatin, other chemotherapy, other medicines, foods, dyes, or preservatives pregnant or trying to get pregnant breast-feeding How should I use this medication? This drug is usually given as an infusion into a vein. It is administered in a hospital or clinic by a specially trained health care professional. Talk to your pediatrician regarding the use of this medicine in children. Special care may be needed. Overdosage: If you think you have taken too much of this medicine contact a poison control center or emergency room  at once. NOTE: This medicine is only for you. Do not share this medicine with others. What if I miss a dose? It is important not to miss a dose. Call your doctor or health care professional if you are unable to keep an appointment. What may interact with this medication? medicines for seizures medicines to increase blood counts like filgrastim, pegfilgrastim, sargramostim some antibiotics like amikacin, gentamicin, neomycin, streptomycin, tobramycin vaccines Talk to your doctor or health care professional before taking any of these medicines: acetaminophen aspirin ibuprofen ketoprofen naproxen This  list may not describe all possible interactions. Give your health care provider a list of all the medicines, herbs, non-prescription drugs, or dietary supplements you use. Also tell them if you smoke, drink alcohol, or use illegal drugs. Some items may interact with your medicine. What should I watch for while using this medication? Your condition will be monitored carefully while you are receiving this medicine. You will need important blood work done while you are taking this medicine. This drug may make you feel generally unwell. This is not uncommon, as chemotherapy can affect healthy cells as well as cancer cells. Report any side effects. Continue your course of treatment even though you feel ill unless your doctor tells you to stop. In some cases, you may be given additional medicines to help with side effects. Follow all directions for their use. Call your doctor or health care professional for advice if you get a fever, chills or sore throat, or other symptoms of a cold or flu. Do not treat yourself. This drug decreases your body's ability to fight infections. Try to avoid being around people who are sick. This medicine may increase your risk to bruise or bleed. Call your doctor or health care professional if you notice any unusual bleeding. Be careful brushing and flossing your teeth or using a toothpick because you may get an infection or bleed more easily. If you have any dental work done, tell your dentist you are receiving this medicine. Avoid taking products that contain aspirin, acetaminophen, ibuprofen, naproxen, or ketoprofen unless instructed by your doctor. These medicines may hide a fever. Do not become pregnant while taking this medicine. Women should inform their doctor if they wish to become pregnant or think they might be pregnant. There is a potential for serious side effects to an unborn child. Talk to your health care professional or pharmacist for more information. Do not  breast-feed an infant while taking this medicine. What side effects may I notice from receiving this medication? Side effects that you should report to your doctor or health care professional as soon as possible: allergic reactions like skin rash, itching or hives, swelling of the face, lips, or tongue signs of infection - fever or chills, cough, sore throat, pain or difficulty passing urine signs of decreased platelets or bleeding - bruising, pinpoint red spots on the skin, black, tarry stools, nosebleeds signs of decreased red blood cells - unusually weak or tired, fainting spells, lightheadedness breathing problems changes in hearing changes in vision chest pain high blood pressure low blood counts - This drug may decrease the number of white blood cells, red blood cells and platelets. You may be at increased risk for infections and bleeding. nausea and vomiting pain, swelling, redness or irritation at the injection site pain, tingling, numbness in the hands or feet problems with balance, talking, walking trouble passing urine or change in the amount of urine Side effects that usually do not require medical attention (report to  your doctor or health care professional if they continue or are bothersome): hair loss loss of appetite metallic taste in the mouth or changes in taste This list may not describe all possible side effects. Call your doctor for medical advice about side effects. You may report side effects to FDA at 1-800-FDA-1088. Where should I keep my medication? This drug is given in a hospital or clinic and will not be stored at home. NOTE: This sheet is a summary. It may not cover all possible information. If you have questions about this medicine, talk to your doctor, pharmacist, or health care provider.  2022 Elsevier/Gold Standard (2007-05-05 14:38:05) Trastuzumab injection for infusion What is this medication? TRASTUZUMAB (tras TOO zoo mab) is a monoclonal antibody.  It is used to treat breast cancer and stomach cancer. This medicine may be used for other purposes; ask your health care provider or pharmacist if you have questions. COMMON BRAND NAME(S): Herceptin, Janae Bridgeman, Ontruzant, Trazimera What should I tell my care team before I take this medication? They need to know if you have any of these conditions: heart disease heart failure lung or breathing disease, like asthma an unusual or allergic reaction to trastuzumab, benzyl alcohol, or other medications, foods, dyes, or preservatives pregnant or trying to get pregnant breast-feeding How should I use this medication? This drug is given as an infusion into a vein. It is administered in a hospital or clinic by a specially trained health care professional. Talk to your pediatrician regarding the use of this medicine in children. This medicine is not approved for use in children. Overdosage: If you think you have taken too much of this medicine contact a poison control center or emergency room at once. NOTE: This medicine is only for you. Do not share this medicine with others. What if I miss a dose? It is important not to miss a dose. Call your doctor or health care professional if you are unable to keep an appointment. What may interact with this medication? This medicine may interact with the following medications: certain types of chemotherapy, such as daunorubicin, doxorubicin, epirubicin, and idarubicin This list may not describe all possible interactions. Give your health care provider a list of all the medicines, herbs, non-prescription drugs, or dietary supplements you use. Also tell them if you smoke, drink alcohol, or use illegal drugs. Some items may interact with your medicine. What should I watch for while using this medication? Visit your doctor for checks on your progress. Report any side effects. Continue your course of treatment even though you feel ill unless your doctor  tells you to stop. Call your doctor or health care professional for advice if you get a fever, chills or sore throat, or other symptoms of a cold or flu. Do not treat yourself. Try to avoid being around people who are sick. You may experience fever, chills and shaking during your first infusion. These effects are usually mild and can be treated with other medicines. Report any side effects during the infusion to your health care professional. Fever and chills usually do not happen with later infusions. Do not become pregnant while taking this medicine or for 7 months after stopping it. Women should inform their doctor if they wish to become pregnant or think they might be pregnant. Women of child-bearing potential will need to have a negative pregnancy test before starting this medicine. There is a potential for serious side effects to an unborn child. Talk to your health care professional or  pharmacist for more information. Do not breast-feed an infant while taking this medicine or for 7 months after stopping it. Women must use effective birth control with this medicine. What side effects may I notice from receiving this medication? Side effects that you should report to your doctor or health care professional as soon as possible: allergic reactions like skin rash, itching or hives, swelling of the face, lips, or tongue chest pain or palpitations cough dizziness feeling faint or lightheaded, falls fever general ill feeling or flu-like symptoms signs of worsening heart failure like breathing problems; swelling in your legs and feet unusually weak or tired Side effects that usually do not require medical attention (report to your doctor or health care professional if they continue or are bothersome): bone pain changes in taste diarrhea joint pain nausea/vomiting weight loss This list may not describe all possible side effects. Call your doctor for medical advice about side effects. You may  report side effects to FDA at 1-800-FDA-1088. Where should I keep my medication? This drug is given in a hospital or clinic and will not be stored at home. NOTE: This sheet is a summary. It may not cover all possible information. If you have questions about this medicine, talk to your doctor, pharmacist, or health care provider.  2022 Elsevier/Gold Standard (2016-01-23 14:37:52) Pegfilgrastim injection What is this medication? PEGFILGRASTIM (PEG fil gra stim) is a long-acting granulocyte colony-stimulating factor that stimulates the growth of neutrophils, a type of white blood cell important in the body's fight against infection. It is used to reduce the incidence of fever and infection in patients with certain types of cancer who are receiving chemotherapy that affects the bone marrow, and to increase survival after being exposed to high doses of radiation. This medicine may be used for other purposes; ask your health care provider or pharmacist if you have questions. COMMON BRAND NAME(S): Rexene Edison, Ziextenzo What should I tell my care team before I take this medication? They need to know if you have any of these conditions: kidney disease latex allergy ongoing radiation therapy sickle cell disease skin reactions to acrylic adhesives (On-Body Injector only) an unusual or allergic reaction to pegfilgrastim, filgrastim, other medicines, foods, dyes, or preservatives pregnant or trying to get pregnant breast-feeding How should I use this medication? This medicine is for injection under the skin. If you get this medicine at home, you will be taught how to prepare and give the pre-filled syringe or how to use the On-body Injector. Refer to the patient Instructions for Use for detailed instructions. Use exactly as directed. Tell your healthcare provider immediately if you suspect that the On-body Injector may not have performed as intended or if you suspect the use of  the On-body Injector resulted in a missed or partial dose. It is important that you put your used needles and syringes in a special sharps container. Do not put them in a trash can. If you do not have a sharps container, call your pharmacist or healthcare provider to get one. Talk to your pediatrician regarding the use of this medicine in children. While this drug may be prescribed for selected conditions, precautions do apply. Overdosage: If you think you have taken too much of this medicine contact a poison control center or emergency room at once. NOTE: This medicine is only for you. Do not share this medicine with others. What if I miss a dose? It is important not to miss your dose. Call your doctor or health  care professional if you miss your dose. If you miss a dose due to an On-body Injector failure or leakage, a new dose should be administered as soon as possible using a single prefilled syringe for manual use. What may interact with this medication? Interactions have not been studied. This list may not describe all possible interactions. Give your health care provider a list of all the medicines, herbs, non-prescription drugs, or dietary supplements you use. Also tell them if you smoke, drink alcohol, or use illegal drugs. Some items may interact with your medicine. What should I watch for while using this medication? Your condition will be monitored carefully while you are receiving this medicine. You may need blood work done while you are taking this medicine. Talk to your health care provider about your risk of cancer. You may be more at risk for certain types of cancer if you take this medicine. If you are going to need a MRI, CT scan, or other procedure, tell your doctor that you are using this medicine (On-Body Injector only). What side effects may I notice from receiving this medication? Side effects that you should report to your doctor or health care professional as soon as  possible: allergic reactions (skin rash, itching or hives, swelling of the face, lips, or tongue) back pain dizziness fever pain, redness, or irritation at site where injected pinpoint red spots on the skin red or dark-brown urine shortness of breath or breathing problems stomach or side pain, or pain at the shoulder swelling tiredness trouble passing urine or change in the amount of urine unusual bruising or bleeding Side effects that usually do not require medical attention (report to your doctor or health care professional if they continue or are bothersome): bone pain muscle pain This list may not describe all possible side effects. Call your doctor for medical advice about side effects. You may report side effects to FDA at 1-800-FDA-1088. Where should I keep my medication? Keep out of the reach of children. If you are using this medicine at home, you will be instructed on how to store it. Throw away any unused medicine after the expiration date on the label. NOTE: This sheet is a summary. It may not cover all possible information. If you have questions about this medicine, talk to your doctor, pharmacist, or health care provider.  2022 Elsevier/Gold Standard (2020-02-25 11:54:14)

## 2020-11-10 ENCOUNTER — Inpatient Hospital Stay: Payer: Medicare Other

## 2020-11-22 NOTE — Progress Notes (Signed)
[facility]  [address] [place],  [place]  [address] [phone]  Clinic Day:  11/28/2020  Referring physician: Charlynn Court, NP  This document serves as a record of services personally performed by [name], MD. It was created on their behalf by [facility], a trained medical scribe. The creation of this record is based on the scribe's personal observations and the provider's statements to them.  HISTORY OF PRESENT ILLNESS:  The patient is a 70 y.o. female with stage IA (T1c N0 M0) hormone/Her2 positive right breast cancer, status post a lumpectomy in June 2022.  She comes in today to be evaluated before heading into her 6th and final cycle of adjuvant TCH chemotherapy.  The patient claims to have tolerated her 5th cycle of chemotherapy very fairly well.  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  From a breast cancer standpoint, she denies having any particular changes in her right breast or elsewhere which concern her for early disease recurrence.  VITALS:  Blood pressure (!) 151/65, pulse 93, temperature 97.7 F (36.5 C), resp. rate 16, height 5' 4.5" (1.638 m), weight 178 lb 6.4 oz (80.9 kg), SpO2 96 %.  Wt Readings from Last 3 Encounters:  11/28/20 178 lb 6.4 oz (80.9 kg)  11/09/20 176 lb (79.8 kg)  11/07/20 178 lb 1.6 oz (80.8 kg)    Body mass index is 30.15 kg/m.  Performance status (ECOG): 0 - Asymptomatic  PHYSICAL EXAM:  Physical Exam Constitutional:      General: She is not in acute distress.    Appearance: Normal appearance. She is normal weight.  HENT:     Head: Normocephalic and atraumatic.  Eyes:     General: No scleral icterus.    Extraocular Movements: Extraocular movements intact.     Conjunctiva/sclera: Conjunctivae normal.     Pupils: Pupils are equal, round, and reactive to light.  Cardiovascular:     Rate and  Rhythm: Normal rate and regular rhythm.     Pulses: Normal pulses.     Heart sounds: Normal heart sounds. No murmur heard.   No friction rub. No gallop.  Pulmonary:     Effort: Pulmonary effort is normal. No respiratory distress.     Breath sounds: Normal breath sounds.  Abdominal:     General: Bowel sounds are normal. There is no distension.     Palpations: Abdomen is soft. There is no hepatomegaly, splenomegaly or mass.     Tenderness: There is no abdominal tenderness.  Musculoskeletal:        General: Normal range of motion.     Cervical back: Normal range of motion and neck supple.     Right lower leg: No edema.     Left lower leg: No edema.  Lymphadenopathy:     Cervical: No cervical adenopathy.  Skin:    General: Skin is warm and dry.  Neurological:     General: No focal deficit present.     Mental Status: She is alert and oriented to person, place, and time. Mental status is at baseline.  Psychiatric:        Mood and Affect: Mood normal.        Behavior: Behavior normal.        Thought Content: Thought content normal.        Judgment: Judgment normal.   LABS:     Ref. Range  11/28/2020 00:00  Sodium Latest Ref Range: 137 - 147  136 (A)  Potassium Latest Ref Range: 3.4 - 5.3  4.0  Chloride Latest Ref Range: 99 - 108  104  CO2 Latest Ref Range: 13 - 22  27 (A)  Glucose Unknown 122  BUN Latest Ref Range: 4 - 21  14  Creatinine Latest Ref Range: 0.5 - 1.1  0.6  Calcium Latest Ref Range: 8.7 - 10.7  8.2 (A)  Alkaline Phosphatase Latest Ref Range: 25 - 125  62  Albumin Latest Ref Range: 3.5 - 5.0  3.5  AST Latest Ref Range: 13 - 35  25  ALT Latest Ref Range: 7 - 35  15  Total Protein Latest Ref Range: 6.3 - 8.2 g/dL 6.1 (A)  Bilirubin, Total Unknown 0.2  WBC Unknown 5.9  RBC Latest Ref Range: 3.87 - 5.11  3.89  Hemoglobin Latest Ref Range: 12.0 - 16.0  12.2  HCT Latest Ref Range: 36 - 46  37  MCV Latest Ref Range: 81 - 99  94  Platelets Latest Ref Range: 150 - 399   209  NEUT# Unknown 4.01   ASSESSMENT & PLAN:  Assessment/Plan:  A  70 y.o. female with stage IA (T1c N0 M0) hormone/Her2 positive breast cancer. She will proceed with her 6th and final cycle of TCH this week.  Her Taxotere will remain decreased by 25%.  She will continue to receive Neulasta with each cycle of chemotherapy to prevent severe neutropenia from developing.  I will see her back in 3 weeks before she continues with maintenance Herceptin only to complete her year of anti-Her2 therapy.   She will also be starting adjuvant endocrine therapy for her breast cancer, as well as be sent to radiation oncology to discuss her adjuvant breast radiation.  The patient understands all the plans discussed today and is in agreement with them.    I, Lauren Curry, am acting as scribe for Dequincy A Lewis, MD    I have reviewed this report as typed by the medical scribe, and it is complete and accurate.  Dequincy A Lewis, MD       

## 2020-11-23 DIAGNOSIS — T451X1D Poisoning by antineoplastic and immunosuppressive drugs, accidental (unintentional), subsequent encounter: Secondary | ICD-10-CM

## 2020-11-23 DIAGNOSIS — C50411 Malignant neoplasm of upper-outer quadrant of right female breast: Secondary | ICD-10-CM

## 2020-11-28 ENCOUNTER — Encounter: Payer: Self-pay | Admitting: Oncology

## 2020-11-28 ENCOUNTER — Other Ambulatory Visit: Payer: Self-pay | Admitting: Hematology and Oncology

## 2020-11-28 ENCOUNTER — Inpatient Hospital Stay: Payer: Medicare Other | Attending: Oncology | Admitting: Oncology

## 2020-11-28 ENCOUNTER — Telehealth: Payer: Self-pay | Admitting: Oncology

## 2020-11-28 ENCOUNTER — Inpatient Hospital Stay: Payer: Medicare Other

## 2020-11-28 VITALS — BP 151/65 | HR 93 | Temp 97.7°F | Resp 16 | Ht 64.5 in | Wt 178.4 lb

## 2020-11-28 DIAGNOSIS — Z170 Estrogen receptor positive status [ER+]: Secondary | ICD-10-CM | POA: Diagnosis not present

## 2020-11-28 DIAGNOSIS — Z79899 Other long term (current) drug therapy: Secondary | ICD-10-CM | POA: Insufficient documentation

## 2020-11-28 DIAGNOSIS — Z5112 Encounter for antineoplastic immunotherapy: Secondary | ICD-10-CM | POA: Insufficient documentation

## 2020-11-28 DIAGNOSIS — C50411 Malignant neoplasm of upper-outer quadrant of right female breast: Secondary | ICD-10-CM | POA: Insufficient documentation

## 2020-11-28 DIAGNOSIS — Z5111 Encounter for antineoplastic chemotherapy: Secondary | ICD-10-CM | POA: Insufficient documentation

## 2020-11-28 DIAGNOSIS — G629 Polyneuropathy, unspecified: Secondary | ICD-10-CM | POA: Insufficient documentation

## 2020-11-28 LAB — CORRECTED CALCIUM (CC13): Calcium, Corrected: 8.7

## 2020-11-28 LAB — CBC
MCV: 94 (ref 81–99)
RBC: 3.89 (ref 3.87–5.11)

## 2020-11-28 LAB — CBC AND DIFFERENTIAL
HCT: 37 (ref 36–46)
Hemoglobin: 12.2 (ref 12.0–16.0)
Neutrophils Absolute: 4.01
Platelets: 209 (ref 150–399)
WBC: 5.9

## 2020-11-28 LAB — HEPATIC FUNCTION PANEL
ALT: 15 (ref 7–35)
AST: 25 (ref 13–35)
Alkaline Phosphatase: 62 (ref 25–125)
Bilirubin, Total: 0.2

## 2020-11-28 LAB — BASIC METABOLIC PANEL
BUN: 14 (ref 4–21)
CO2: 27 — AB (ref 13–22)
Chloride: 104 (ref 99–108)
Creatinine: 0.6 (ref 0.5–1.1)
Glucose: 122
Potassium: 4 (ref 3.4–5.3)
Sodium: 136 — AB (ref 137–147)

## 2020-11-28 LAB — COMPREHENSIVE METABOLIC PANEL
Albumin: 3.5 (ref 3.5–5.0)
Calcium: 8.2 — AB (ref 8.7–10.7)

## 2020-11-28 LAB — PROTEIN, TOTAL: Total Protein: 6.1 g/dL — AB (ref 6.3–8.2)

## 2020-11-28 NOTE — Telephone Encounter (Signed)
Per 10/18 LOS, patient scheduled for Nov Appt's.  Gave patient Appt Summary

## 2020-11-29 MED FILL — Docetaxel Soln for IV Infusion 160 MG/16ML: INTRAVENOUS | Qty: 11 | Status: AC

## 2020-11-29 MED FILL — Trastuzumab-anns For IV Soln 150 MG: INTRAVENOUS | Qty: 22 | Status: AC

## 2020-11-29 MED FILL — Carboplatin IV Soln 600 MG/60ML: INTRAVENOUS | Qty: 53 | Status: AC

## 2020-11-30 ENCOUNTER — Inpatient Hospital Stay: Payer: Medicare Other

## 2020-11-30 ENCOUNTER — Other Ambulatory Visit: Payer: Self-pay

## 2020-11-30 VITALS — BP 140/63 | HR 102 | Temp 97.7°F | Resp 18 | Ht 64.5 in | Wt 178.1 lb

## 2020-11-30 DIAGNOSIS — C50411 Malignant neoplasm of upper-outer quadrant of right female breast: Secondary | ICD-10-CM

## 2020-11-30 DIAGNOSIS — Z170 Estrogen receptor positive status [ER+]: Secondary | ICD-10-CM | POA: Diagnosis not present

## 2020-11-30 DIAGNOSIS — Z5112 Encounter for antineoplastic immunotherapy: Secondary | ICD-10-CM | POA: Diagnosis not present

## 2020-11-30 DIAGNOSIS — Z79899 Other long term (current) drug therapy: Secondary | ICD-10-CM | POA: Diagnosis not present

## 2020-11-30 DIAGNOSIS — G629 Polyneuropathy, unspecified: Secondary | ICD-10-CM | POA: Diagnosis not present

## 2020-11-30 DIAGNOSIS — Z5111 Encounter for antineoplastic chemotherapy: Secondary | ICD-10-CM | POA: Diagnosis present

## 2020-11-30 MED ORDER — ACETAMINOPHEN 325 MG PO TABS
650.0000 mg | ORAL_TABLET | Freq: Once | ORAL | Status: AC
Start: 2020-11-30 — End: 2020-11-30
  Administered 2020-11-30: 650 mg via ORAL
  Filled 2020-11-30: qty 2

## 2020-11-30 MED ORDER — DIPHENHYDRAMINE HCL 25 MG PO CAPS
50.0000 mg | ORAL_CAPSULE | Freq: Once | ORAL | Status: AC
Start: 2020-11-30 — End: 2020-11-30
  Administered 2020-11-30: 50 mg via ORAL
  Filled 2020-11-30: qty 2

## 2020-11-30 MED ORDER — HEPARIN SOD (PORK) LOCK FLUSH 100 UNIT/ML IV SOLN
500.0000 [IU] | Freq: Once | INTRAVENOUS | Status: AC | PRN
  Administered 2020-11-30: 500 [IU]

## 2020-11-30 MED ORDER — SODIUM CHLORIDE 0.9 % IV SOLN
150.0000 mg | Freq: Once | INTRAVENOUS | Status: AC
  Administered 2020-11-30: 150 mg via INTRAVENOUS
  Filled 2020-11-30: qty 5

## 2020-11-30 MED ORDER — SODIUM CHLORIDE 0.9 % IV SOLN
10.0000 mg | Freq: Once | INTRAVENOUS | Status: AC
  Administered 2020-11-30: 10 mg via INTRAVENOUS
  Filled 2020-11-30: qty 1

## 2020-11-30 MED ORDER — SODIUM CHLORIDE 0.9 % IV SOLN
56.2500 mg/m2 | Freq: Once | INTRAVENOUS | Status: AC
  Administered 2020-11-30: 110 mg via INTRAVENOUS
  Filled 2020-11-30: qty 11

## 2020-11-30 MED ORDER — TRASTUZUMAB-ANNS CHEMO 150 MG IV SOLR
6.0000 mg/kg | Freq: Once | INTRAVENOUS | Status: AC
  Administered 2020-11-30: 462 mg via INTRAVENOUS
  Filled 2020-11-30: qty 22

## 2020-11-30 MED ORDER — SODIUM CHLORIDE 0.9 % IV SOLN
533.4000 mg | Freq: Once | INTRAVENOUS | Status: AC
  Administered 2020-11-30: 530 mg via INTRAVENOUS
  Filled 2020-11-30: qty 53

## 2020-11-30 MED ORDER — SODIUM CHLORIDE 0.9% FLUSH
10.0000 mL | INTRAVENOUS | Status: DC | PRN
  Administered 2020-11-30: 10 mL

## 2020-11-30 MED ORDER — SODIUM CHLORIDE 0.9 % IV SOLN: Freq: Once | INTRAVENOUS | Status: AC

## 2020-11-30 MED ORDER — PALONOSETRON HCL INJECTION 0.25 MG/5ML
0.2500 mg | Freq: Once | INTRAVENOUS | Status: AC
  Administered 2020-11-30: 0.25 mg via INTRAVENOUS
  Filled 2020-11-30: qty 5

## 2020-11-30 NOTE — Patient Instructions (Signed)
[facility]  Discharge Instructions: Thank you for choosing [facility] to provide your oncology and hematology care.  If you have a lab appointment with the [facility], please go directly to the [facility] and check in at the registration area.   Wear comfortable clothing and clothing appropriate for easy access to any Portacath or PICC line.   We strive to give you quality time with your provider. You may need to reschedule your appointment if you arrive late (15 or more minutes).  Arriving late affects you and other patients whose appointments are after yours.  Also, if you miss three or more appointments without notifying the office, you may be dismissed from the clinic at the provider's discretion.      For prescription refill requests, have your pharmacy contact our office and allow 72 hours for refills to be completed.    Today you received the following chemotherapy and/or immunotherapy agents docetaxel,carbo,trastuzumab   To help prevent nausea and vomiting after your treatment, we encourage you to take your nausea medication as directed.  BELOW ARE SYMPTOMS THAT SHOULD BE REPORTED IMMEDIATELY: *FEVER GREATER THAN 100.4 F (38 C) OR HIGHER *CHILLS OR SWEATING *NAUSEA AND VOMITING THAT IS NOT CONTROLLED WITH YOUR NAUSEA MEDICATION *UNUSUAL SHORTNESS OF BREATH *UNUSUAL BRUISING OR BLEEDING *URINARY PROBLEMS (pain or burning when urinating, or frequent urination) *BOWEL PROBLEMS (unusual diarrhea, constipation, pain near the anus) TENDERNESS IN MOUTH AND THROAT WITH OR WITHOUT PRESENCE OF ULCERS (sore throat, sores in mouth, or a toothache) UNUSUAL RASH, SWELLING OR PAIN  UNUSUAL VAGINAL DISCHARGE OR ITCHING   Items with * indicate a potential emergency and should be followed up as soon as possible or go to the Emergency Department if any problems should occur.  Please show the CHEMOTHERAPY ALERT CARD or IMMUNOTHERAPY ALERT CARD at  check-in to the Emergency Department and triage nurse.  Should you have questions after your visit or need to cancel or reschedule your appointment, please contact [facility]  Dept: [phone]  and follow the prompts.  Office hours are 8:00 a.m. to 4:30 p.m. Monday - Friday. Please note that voicemails left after 4:00 p.m. may not be returned until the following business day.  We are closed weekends and major holidays. You have access to a nurse at all times for urgent questions. Please call the main number to the clinic Dept: [phone] and follow the prompts.  For any non-urgent questions, you may also contact your provider using MyChart. We now offer e-Visits for anyone 38 and older to request care online for non-urgent symptoms. For details visit [email or web address].   Also download the MyChart app! Go to the app store, search "MyChart", open the app, select [facility], and log in with your MyChart username and password.  Due to Covid, a mask is required upon entering the hospital/clinic. If you do not have a mask, one will be given to you upon arrival. For doctor visits, patients may have 1 support person aged 72 or older with them. For treatment visits, patients cannot have anyone with them due to current Covid guidelines and our immunocompromised population.   Carboplatin injection What is this medication? CARBOPLATIN (KAR boe pla tin) is a chemotherapy drug. It targets fast dividing cells, like cancer cells, and causes these cells to die. This medicine is used to treat ovarian cancer and many other cancers. This medicine may be used for other purposes; ask your health care provider  or pharmacist if you have questions. COMMON BRAND NAME(S): Paraplatin What should I tell my care team before I take this medication? They need to know if you have any of these conditions: blood disorders hearing problems kidney disease recent or ongoing radiation therapy an  unusual or allergic reaction to carboplatin, cisplatin, other chemotherapy, other medicines, foods, dyes, or preservatives pregnant or trying to get pregnant breast-feeding How should I use this medication? This drug is usually given as an infusion into a vein. It is administered in a hospital or clinic by a specially trained health care professional. Talk to your pediatrician regarding the use of this medicine in children. Special care may be needed. Overdosage: If you think you have taken too much of this medicine contact a poison control center or emergency room at once. NOTE: This medicine is only for you. Do not share this medicine with others. What if I miss a dose? It is important not to miss a dose. Call your doctor or health care professional if you are unable to keep an appointment. What may interact with this medication? medicines for seizures medicines to increase blood counts like filgrastim, pegfilgrastim, sargramostim some antibiotics like amikacin, gentamicin, neomycin, streptomycin, tobramycin vaccines Talk to your doctor or health care professional before taking any of these medicines: acetaminophen aspirin ibuprofen ketoprofen naproxen This list may not describe all possible interactions. Give your health care provider a list of all the medicines, herbs, non-prescription drugs, or dietary supplements you use. Also tell them if you smoke, drink alcohol, or use illegal drugs. Some items may interact with your medicine. What should I watch for while using this medication? Your condition will be monitored carefully while you are receiving this medicine. You will need important blood work done while you are taking this medicine. This drug may make you feel generally unwell. This is not uncommon, as chemotherapy can affect healthy cells as well as cancer cells. Report any side effects. Continue your course of treatment even though you feel ill unless your doctor tells you to  stop. In some cases, you may be given additional medicines to help with side effects. Follow all directions for their use. Call your doctor or health care professional for advice if you get a fever, chills or sore throat, or other symptoms of a cold or flu. Do not treat yourself. This drug decreases your body's ability to fight infections. Try to avoid being around people who are sick. This medicine may increase your risk to bruise or bleed. Call your doctor or health care professional if you notice any unusual bleeding. Be careful brushing and flossing your teeth or using a toothpick because you may get an infection or bleed more easily. If you have any dental work done, tell your dentist you are receiving this medicine. Avoid taking products that contain aspirin, acetaminophen, ibuprofen, naproxen, or ketoprofen unless instructed by your doctor. These medicines may hide a fever. Do not become pregnant while taking this medicine. Women should inform their doctor if they wish to become pregnant or think they might be pregnant. There is a potential for serious side effects to an unborn child. Talk to your health care professional or pharmacist for more information. Do not breast-feed an infant while taking this medicine. What side effects may I notice from receiving this medication? Side effects that you should report to your doctor or health care professional as soon as possible: allergic reactions like skin rash, itching or hives, swelling of the face, lips, or  tongue signs of infection - fever or chills, cough, sore throat, pain or difficulty passing urine signs of decreased platelets or bleeding - bruising, pinpoint red spots on the skin, black, tarry stools, nosebleeds signs of decreased red blood cells - unusually weak or tired, fainting spells, lightheadedness breathing problems changes in hearing changes in vision chest pain high blood pressure low blood counts - This drug may decrease the  number of white blood cells, red blood cells and platelets. You may be at increased risk for infections and bleeding. nausea and vomiting pain, swelling, redness or irritation at the injection site pain, tingling, numbness in the hands or feet problems with balance, talking, walking trouble passing urine or change in the amount of urine Side effects that usually do not require medical attention (report to your doctor or health care professional if they continue or are bothersome): hair loss loss of appetite metallic taste in the mouth or changes in taste This list may not describe all possible side effects. Call your doctor for medical advice about side effects. You may report side effects to FDA at 1-800-FDA-1088. Where should I keep my medication? This drug is given in a hospital or clinic and will not be stored at home. NOTE: This sheet is a summary. It may not cover all possible information. If you have questions about this medicine, talk to your doctor, pharmacist, or health care provider.  2022 Elsevier/Gold Standard (2007-05-05 14:38:05) Trastuzumab injection for infusion What is this medication? TRASTUZUMAB (tras TOO zoo mab) is a monoclonal antibody. It is used to treat breast cancer and stomach cancer. This medicine may be used for other purposes; ask your health care provider or pharmacist if you have questions. COMMON BRAND NAME(S): Herceptin, Janae Bridgeman, Ontruzant, Trazimera What should I tell my care team before I take this medication? They need to know if you have any of these conditions: heart disease heart failure lung or breathing disease, like asthma an unusual or allergic reaction to trastuzumab, benzyl alcohol, or other medications, foods, dyes, or preservatives pregnant or trying to get pregnant breast-feeding How should I use this medication? This drug is given as an infusion into a vein. It is administered in a hospital or clinic by a specially  trained health care professional. Talk to your pediatrician regarding the use of this medicine in children. This medicine is not approved for use in children. Overdosage: If you think you have taken too much of this medicine contact a poison control center or emergency room at once. NOTE: This medicine is only for you. Do not share this medicine with others. What if I miss a dose? It is important not to miss a dose. Call your doctor or health care professional if you are unable to keep an appointment. What may interact with this medication? This medicine may interact with the following medications: certain types of chemotherapy, such as daunorubicin, doxorubicin, epirubicin, and idarubicin This list may not describe all possible interactions. Give your health care provider a list of all the medicines, herbs, non-prescription drugs, or dietary supplements you use. Also tell them if you smoke, drink alcohol, or use illegal drugs. Some items may interact with your medicine. What should I watch for while using this medication? Visit your doctor for checks on your progress. Report any side effects. Continue your course of treatment even though you feel ill unless your doctor tells you to stop. Call your doctor or health care professional for advice if you get a fever, chills  or sore throat, or other symptoms of a cold or flu. Do not treat yourself. Try to avoid being around people who are sick. You may experience fever, chills and shaking during your first infusion. These effects are usually mild and can be treated with other medicines. Report any side effects during the infusion to your health care professional. Fever and chills usually do not happen with later infusions. Do not become pregnant while taking this medicine or for 7 months after stopping it. Women should inform their doctor if they wish to become pregnant or think they might be pregnant. Women of child-bearing potential will need to have a  negative pregnancy test before starting this medicine. There is a potential for serious side effects to an unborn child. Talk to your health care professional or pharmacist for more information. Do not breast-feed an infant while taking this medicine or for 7 months after stopping it. Women must use effective birth control with this medicine. What side effects may I notice from receiving this medication? Side effects that you should report to your doctor or health care professional as soon as possible: allergic reactions like skin rash, itching or hives, swelling of the face, lips, or tongue chest pain or palpitations cough dizziness feeling faint or lightheaded, falls fever general ill feeling or flu-like symptoms signs of worsening heart failure like breathing problems; swelling in your legs and feet unusually weak or tired Side effects that usually do not require medical attention (report to your doctor or health care professional if they continue or are bothersome): bone pain changes in taste diarrhea joint pain nausea/vomiting weight loss This list may not describe all possible side effects. Call your doctor for medical advice about side effects. You may report side effects to FDA at 1-800-FDA-1088. Where should I keep my medication? This drug is given in a hospital or clinic and will not be stored at home. NOTE: This sheet is a summary. It may not cover all possible information. If you have questions about this medicine, talk to your doctor, pharmacist, or health care provider.  2022 Elsevier/Gold Standard (2016-01-23 14:37:52) Docetaxel injection What is this medication? DOCETAXEL (doe se TAX el) is a chemotherapy drug. It targets fast dividing cells, like cancer cells, and causes these cells to die. This medicine is used to treat many types of cancers like breast cancer, certain stomach cancers, head and neck cancer, lung cancer, and prostate cancer. This medicine may be used for  other purposes; ask your health care provider or pharmacist if you have questions. COMMON BRAND NAME(S): Docefrez, Taxotere What should I tell my care team before I take this medication? They need to know if you have any of these conditions: infection (especially a virus infection such as chickenpox, cold sores, or herpes) liver disease low blood counts, like low white cell, platelet, or red cell counts an unusual or allergic reaction to docetaxel, polysorbate 80, other chemotherapy agents, other medicines, foods, dyes, or preservatives pregnant or trying to get pregnant breast-feeding How should I use this medication? This drug is given as an infusion into a vein. It is administered in a hospital or clinic by a specially trained health care professional. Talk to your pediatrician regarding the use of this medicine in children. Special care may be needed. Overdosage: If you think you have taken too much of this medicine contact a poison control center or emergency room at once. NOTE: This medicine is only for you. Do not share this medicine with others. What  if I miss a dose? It is important not to miss your dose. Call your doctor or health care professional if you are unable to keep an appointment. What may interact with this medication? Do not take this medicine with any of the following medications: live virus vaccines This medicine may also interact with the following medications: aprepitant certain antibiotics like erythromycin or clarithromycin certain antivirals for HIV or hepatitis certain medicines for fungal infections like fluconazole, itraconazole, ketoconazole, posaconazole, or voriconazole cimetidine ciprofloxacin conivaptan cyclosporine dronedarone fluvoxamine grapefruit juice imatinib verapamil This list may not describe all possible interactions. Give your health care provider a list of all the medicines, herbs, non-prescription drugs, or dietary supplements you  use. Also tell them if you smoke, drink alcohol, or use illegal drugs. Some items may interact with your medicine. What should I watch for while using this medication? Your condition will be monitored carefully while you are receiving this medicine. You will need important blood work done while you are taking this medicine. Call your doctor or health care professional for advice if you get a fever, chills or sore throat, or other symptoms of a cold or flu. Do not treat yourself. This drug decreases your body's ability to fight infections. Try to avoid being around people who are sick. Some products may contain alcohol. Ask your health care professional if this medicine contains alcohol. Be sure to tell all health care professionals you are taking this medicine. Certain medicines, like metronidazole and disulfiram, can cause an unpleasant reaction when taken with alcohol. The reaction includes flushing, headache, nausea, vomiting, sweating, and increased thirst. The reaction can last from 30 minutes to several hours. You may get drowsy or dizzy. Do not drive, use machinery, or do anything that needs mental alertness until you know how this medicine affects you. Do not stand or sit up quickly, especially if you are an older patient. This reduces the risk of dizzy or fainting spells. Alcohol may interfere with the effect of this medicine. Talk to your health care professional about your risk of cancer. You may be more at risk for certain types of cancer if you take this medicine. Do not become pregnant while taking this medicine or for 6 months after stopping it. Women should inform their doctor if they wish to become pregnant or think they might be pregnant. There is a potential for serious side effects to an unborn child. Talk to your health care professional or pharmacist for more information. Do not breast-feed an infant while taking this medicine or for 1 week after stopping it. Males who get this medicine  must use a condom during sex with females who can get pregnant. If you get a woman pregnant, the baby could have birth defects. The baby could die before they are born. You will need to continue wearing a condom for 3 months after stopping the medicine. Tell your health care provider right away if your partner becomes pregnant while you are taking this medicine. This may interfere with the ability to father a child. You should talk to your doctor or health care professional if you are concerned about your fertility. What side effects may I notice from receiving this medication? Side effects that you should report to your doctor or health care professional as soon as possible: allergic reactions like skin rash, itching or hives, swelling of the face, lips, or tongue blurred vision breathing problems changes in vision low blood counts - This drug may decrease the number of white  blood cells, red blood cells and platelets. You may be at increased risk for infections and bleeding. nausea and vomiting pain, redness or irritation at site where injected pain, tingling, numbness in the hands or feet redness, blistering, peeling, or loosening of the skin, including inside the mouth signs of decreased platelets or bleeding - bruising, pinpoint red spots on the skin, black, tarry stools, nosebleeds signs of decreased red blood cells - unusually weak or tired, fainting spells, lightheadedness signs of infection - fever or chills, cough, sore throat, pain or difficulty passing urine swelling of the ankle, feet, hands Side effects that usually do not require medical attention (report to your doctor or health care professional if they continue or are bothersome): constipation diarrhea fingernail or toenail changes hair loss loss of appetite mouth sores muscle pain This list may not describe all possible side effects. Call your doctor for medical advice about side effects. You may report side effects to FDA  at 1-800-FDA-1088. Where should I keep my medication? This drug is given in a hospital or clinic and will not be stored at home. NOTE: This sheet is a summary. It may not cover all possible information. If you have questions about this medicine, talk to your doctor, pharmacist, or health care provider.  2022 Elsevier/Gold Standard (2018-12-28 19:50:31)

## 2020-12-01 ENCOUNTER — Inpatient Hospital Stay: Payer: Medicare Other

## 2020-12-01 VITALS — BP 140/65 | HR 98 | Temp 98.1°F | Resp 18 | Ht 64.5 in | Wt 178.0 lb

## 2020-12-01 DIAGNOSIS — Z5112 Encounter for antineoplastic immunotherapy: Secondary | ICD-10-CM | POA: Diagnosis not present

## 2020-12-01 DIAGNOSIS — Z170 Estrogen receptor positive status [ER+]: Secondary | ICD-10-CM

## 2020-12-01 MED ORDER — PEGFILGRASTIM-BMEZ 6 MG/0.6ML ~~LOC~~ SOSY
6.0000 mg | PREFILLED_SYRINGE | Freq: Once | SUBCUTANEOUS | Status: AC
  Administered 2020-12-01: 6 mg via SUBCUTANEOUS
  Filled 2020-12-01: qty 0.6

## 2020-12-01 NOTE — Patient Instructions (Signed)

## 2020-12-14 ENCOUNTER — Encounter: Payer: Self-pay | Admitting: Oncology

## 2020-12-15 ENCOUNTER — Other Ambulatory Visit: Payer: Self-pay | Admitting: Oncology

## 2020-12-18 NOTE — Progress Notes (Signed)
[facility]  [address] [place],  [place]  [address] [phone]  Clinic Day:  12/19/2020  Referring physician: Charlynn Court, NP  This document serves as a record of services personally performed by [name], MD. It was created on their behalf by Curry,Lauren E, a trained medical scribe. The creation of this record is based on the scribe's personal observations and the provider's statements to them.  HISTORY OF PRESENT ILLNESS:  The patient is a 70 y.o. female with stage IA (T1c N0 M0) hormone/Her2 positive right breast cancer, status post a lumpectomy in June 2022.  She completed 6 cycles of adjuvant TCH chemotherapy in October. She will now be continuing  with maintenance Herceptin only to complete her year of anti-Her2 therapy. The patient claims to have tolerated her 6th cycle of chemotherapy very fairly well.  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  From a breast cancer standpoint, she denies having any particular changes in her right breast or elsewhere which concern her for early disease recurrence.  VITALS:  Blood pressure (!) 160/70, pulse (!) 103, temperature 98 F (36.7 C), resp. rate 16, height 5' 4.5" (1.638 m), weight 182 lb 4.8 oz (82.7 kg), SpO2 93 %.  Wt Readings from Last 3 Encounters:  12/19/20 182 lb 4.8 oz (82.7 kg)  12/01/20 178 lb (80.7 kg)  11/30/20 178 lb 1.3 oz (80.8 kg)    Body mass index is 30.81 kg/m.  Performance status (ECOG): 1  PHYSICAL EXAM:  Physical Exam Constitutional:      General: She is not in acute distress.    Appearance: Normal appearance. She is normal weight.  HENT:     Head: Normocephalic and atraumatic.     Mouth/Throat:     Pharynx: Oropharynx is clear. No oropharyngeal exudate.  Eyes:     General: No scleral icterus.    Extraocular Movements: Extraocular movements intact.     Conjunctiva/sclera: Conjunctivae normal.      Pupils: Pupils are equal, round, and reactive to light.  Cardiovascular:     Rate and Rhythm: Normal rate and regular rhythm.     Pulses: Normal pulses.     Heart sounds: Normal heart sounds. No murmur heard.   No friction rub. No gallop.  Pulmonary:     Effort: Pulmonary effort is normal. No respiratory distress.     Breath sounds: Normal breath sounds.  Chest:  Breasts:    Right: No swelling, bleeding, inverted nipple, mass, nipple discharge or skin change.     Left: No swelling, bleeding, inverted nipple, mass, nipple discharge or skin change.  Abdominal:     General: Bowel sounds are normal. There is no distension.     Palpations: Abdomen is soft. There is no hepatomegaly, splenomegaly or mass.     Tenderness: There is no abdominal tenderness.  Musculoskeletal:        General: No tenderness. Normal range of motion.     Cervical back: Normal range of motion and neck supple.     Right lower leg: No edema.     Left lower leg: No edema.  Lymphadenopathy:     Cervical: No cervical adenopathy.     Right cervical: No superficial, deep or posterior cervical adenopathy.    Left cervical: No superficial, deep or posterior cervical adenopathy.     Upper Body:     Right upper body: No supraclavicular or axillary adenopathy.  Left upper body: No supraclavicular or axillary adenopathy.     Lower Body: No right inguinal adenopathy. No left inguinal adenopathy.  Skin:    General: Skin is warm and dry.     Coloration: Skin is not jaundiced.     Findings: No lesion or rash.  Neurological:     General: No focal deficit present.     Mental Status: She is alert and oriented to person, place, and time. Mental status is at baseline.  Psychiatric:        Mood and Affect: Mood normal.        Behavior: Behavior normal.        Thought Content: Thought content normal.        Judgment: Judgment normal.   ASSESSMENT & PLAN:  Assessment/Plan:  A  70 y.o. female with stage IA (T1c N0 M0)  hormone/Her2 positive breast cancer. She will proceed with maintenance Herceptin to finish completing her full year of anti-Her2 therapy.  She will also be starting adjuvant endocrine therapy for her breast cancer, which will be anastrozole.  She will take this daily for 5 years.   She will see radiation oncology in the forthcoming days to discuss her adjuvant breast radiation.  Clinically, she appears to be doing okay.  I will see her back in 12 weeks for repeat clinical assessment.  The patient understands all the plans discussed today and is in agreement with them.    I, [name], am acting as scribe for [name], MD    I have reviewed this report as typed by the medical scribe, and it is complete and accurate.  Dequincy Macarthur Critchley, MD

## 2020-12-19 ENCOUNTER — Other Ambulatory Visit: Payer: Self-pay | Admitting: Oncology

## 2020-12-19 ENCOUNTER — Inpatient Hospital Stay (INDEPENDENT_AMBULATORY_CARE_PROVIDER_SITE_OTHER): Payer: Medicare Other | Admitting: Oncology

## 2020-12-19 ENCOUNTER — Inpatient Hospital Stay: Payer: Medicare Other | Attending: Oncology

## 2020-12-19 VITALS — BP 160/70 | HR 103 | Temp 98.0°F | Resp 16 | Ht 64.5 in | Wt 182.3 lb

## 2020-12-19 DIAGNOSIS — Z170 Estrogen receptor positive status [ER+]: Secondary | ICD-10-CM | POA: Insufficient documentation

## 2020-12-19 DIAGNOSIS — Z5112 Encounter for antineoplastic immunotherapy: Secondary | ICD-10-CM | POA: Insufficient documentation

## 2020-12-19 DIAGNOSIS — C50411 Malignant neoplasm of upper-outer quadrant of right female breast: Secondary | ICD-10-CM | POA: Insufficient documentation

## 2020-12-19 MED ORDER — ANASTROZOLE 1 MG PO TABS: 1.0000 mg | ORAL_TABLET | Freq: Every day | ORAL | 3 refills | Status: DC

## 2020-12-19 NOTE — Progress Notes (Incomplete)
[facility]  [address] [place],  [place]  [address] [phone]  Clinic Day:  12/19/2020  Referring physician: No ref. provider found  This document serves as a record of services personally performed by [name], MD. It was created on their behalf by Curry,Lauren E, a trained medical scribe. The creation of this record is based on the scribe's personal observations and the provider's statements to them.  HISTORY OF PRESENT ILLNESS:  The patient is a 70 y.o. female with stage IA (T1c N0 M0) hormone/Her2 positive right breast cancer, status post a lumpectomy in June 2022.  She just completed 6 cycles of adjuvant TCH chemotherapy in October 2022.  She comes in today to be evaluated before continuing with her maintenance Herceptin to complete 1 year of anti-Her2 therapy. The patient claims to have tolerated her 6th cycle of chemotherapy very fairly well.  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  VITALS:  There were no vitals taken for this visit.  Wt Readings from Last 3 Encounters:  12/19/20 182 lb 4.8 oz (82.7 kg)  12/01/20 178 lb (80.7 kg)  11/30/20 178 lb 1.3 oz (80.8 kg)    There is no height or weight on file to calculate BMI.  Performance status (ECOG): 0 - Asymptomatic  PHYSICAL EXAM:  Physical Exam Constitutional:      General: She is not in acute distress.    Appearance: Normal appearance. She is normal weight.  HENT:     Head: Normocephalic and atraumatic.  Eyes:     General: No scleral icterus.    Extraocular Movements: Extraocular movements intact.     Conjunctiva/sclera: Conjunctivae normal.     Pupils: Pupils are equal, round, and reactive to light.  Cardiovascular:     Rate and Rhythm: Normal rate and regular rhythm.     Pulses:  Normal pulses.     Heart sounds: Normal heart sounds. No murmur heard.   No friction rub. No gallop.  Pulmonary:     Effort: Pulmonary effort is normal. No respiratory distress.     Breath sounds: Normal breath sounds.  Abdominal:     General: Bowel sounds are normal. There is no distension.     Palpations: Abdomen is soft. There is no hepatomegaly, splenomegaly or mass.     Tenderness: There is no abdominal tenderness.  Musculoskeletal:        General: Normal range of motion.     Cervical back: Normal range of motion and neck supple.     Right lower leg: No edema.     Left lower leg: No edema.  Lymphadenopathy:     Cervical: No cervical adenopathy.  Skin:    General: Skin is warm and dry.  Neurological:     General: No focal deficit present.     Mental Status: She is alert and oriented to person, place, and time. Mental status is at baseline.  Psychiatric:        Mood and Affect: Mood normal.        Behavior: Behavior normal.        Thought Content: Thought content normal.        Judgment: Judgment normal.   LABS:     Ref. Range 11/28/2020 00:00  Sodium Latest Ref Range: 137 - 147  136 (A)  Potassium Latest Ref Range: 3.4 - 5.3  4.0  Chloride Latest Ref Range: 99 - 108  104  CO2 Latest Ref Range: 13 - 22  27 (A)  Glucose Unknown 122  BUN Latest Ref Range: 4 - 21  14  Creatinine Latest Ref Range: 0.5 - 1.1  0.6  Calcium Latest Ref Range: 8.7 - 10.7  8.2 (A)  Alkaline Phosphatase Latest Ref Range: 25 - 125  62  Albumin Latest Ref Range: 3.5 - 5.0  3.5  AST Latest Ref Range: 13 - 35  25  ALT Latest Ref Range: 7 - 35  15  Total Protein Latest Ref Range: 6.3 - 8.2 g/dL 6.1 (A)  Bilirubin, Total Unknown 0.2  WBC Unknown 5.9  RBC Latest Ref Range: 3.87 - 5.11  3.89  Hemoglobin Latest Ref Range: 12.0 - 16.0  12.2  HCT Latest Ref Range: 36 - 46  37  MCV Latest Ref Range: 81 - 99  94  Platelets Latest Ref Range: 150 - 399  209  NEUT# Unknown 4.01   ASSESSMENT & PLAN:   Assessment/Plan:  A  70 y.o. female with stage IA (T1c N0 M0) hormone/Her2 positive breast cancer. She will proceed with maintenance Herceptin only to complete her year of anti-Her2 therapy.  She will also be starting adjuvant endocrine therapy for her breast cancer, as well as be sent to radiation oncology to discuss her adjuvant breast radiation.  I will see her back in 3 weeks before she heads into her next cycle of maintenance Herceptin. The patient understands all the plans discussed today and is in agreement with them.    I, [name], am acting as scribe for [name], MD    I have reviewed this report as typed by the medical scribe, and it is complete and accurate.  Dequincy Macarthur Critchley, MD

## 2020-12-20 ENCOUNTER — Other Ambulatory Visit: Payer: Self-pay | Admitting: Oncology

## 2020-12-21 ENCOUNTER — Inpatient Hospital Stay: Payer: Medicare Other

## 2020-12-21 ENCOUNTER — Other Ambulatory Visit: Payer: Self-pay

## 2020-12-21 DIAGNOSIS — Z170 Estrogen receptor positive status [ER+]: Secondary | ICD-10-CM

## 2020-12-21 DIAGNOSIS — C50411 Malignant neoplasm of upper-outer quadrant of right female breast: Secondary | ICD-10-CM | POA: Diagnosis present

## 2020-12-21 DIAGNOSIS — Z5112 Encounter for antineoplastic immunotherapy: Secondary | ICD-10-CM | POA: Diagnosis present

## 2020-12-21 MED ORDER — DIPHENHYDRAMINE HCL 25 MG PO CAPS
50.0000 mg | ORAL_CAPSULE | Freq: Once | ORAL | Status: AC
  Administered 2020-12-21: 50 mg via ORAL
  Filled 2020-12-21: qty 2

## 2020-12-21 MED ORDER — SODIUM CHLORIDE 0.9 % IV SOLN: Freq: Once | INTRAVENOUS | Status: AC

## 2020-12-21 MED ORDER — SODIUM CHLORIDE 0.9% FLUSH
10.0000 mL | INTRAVENOUS | Status: DC | PRN
  Administered 2020-12-21: 10 mL

## 2020-12-21 MED ORDER — TRASTUZUMAB-ANNS CHEMO 150 MG IV SOLR
6.0000 mg/kg | Freq: Once | INTRAVENOUS | Status: AC
  Administered 2020-12-21: 462 mg via INTRAVENOUS
  Filled 2020-12-21: qty 22

## 2020-12-21 MED ORDER — ACETAMINOPHEN 325 MG PO TABS
650.0000 mg | ORAL_TABLET | Freq: Once | ORAL | Status: AC
  Administered 2020-12-21: 650 mg via ORAL
  Filled 2020-12-21: qty 2

## 2020-12-21 MED ORDER — HEPARIN SOD (PORK) LOCK FLUSH 100 UNIT/ML IV SOLN
500.0000 [IU] | Freq: Once | INTRAVENOUS | Status: AC | PRN
  Administered 2020-12-21: 500 [IU]

## 2020-12-21 NOTE — Patient Instructions (Signed)
[facility]  Discharge Instructions: Thank you for choosing [facility] to provide your oncology and hematology care.  If you have a lab appointment with the Cancer Center, please go directly to the Cancer Center and check in at the registration area.   Wear comfortable clothing and clothing appropriate for easy access to any Portacath or PICC line.   We strive to give you quality time with your provider. You may need to reschedule your appointment if you arrive late (15 or more minutes).  Arriving late affects you and other patients whose appointments are after yours.  Also, if you miss three or more appointments without notifying the office, you may be dismissed from the clinic at the provider's discretion.      For prescription refill requests, have your pharmacy contact our office and allow 72 hours for refills to be completed.    Today you received the following chemotherapy and/or immunotherapy agents Trastuzumab     To help prevent nausea and vomiting after your treatment, we encourage you to take your nausea medication as directed.  BELOW ARE SYMPTOMS THAT SHOULD BE REPORTED IMMEDIATELY: *FEVER GREATER THAN 100.4 F (38 C) OR HIGHER *CHILLS OR SWEATING *NAUSEA AND VOMITING THAT IS NOT CONTROLLED WITH YOUR NAUSEA MEDICATION *UNUSUAL SHORTNESS OF BREATH *UNUSUAL BRUISING OR BLEEDING *URINARY PROBLEMS (pain or burning when urinating, or frequent urination) *BOWEL PROBLEMS (unusual diarrhea, constipation, pain near the anus) TENDERNESS IN MOUTH AND THROAT WITH OR WITHOUT PRESENCE OF ULCERS (sore throat, sores in mouth, or a toothache) UNUSUAL RASH, SWELLING OR PAIN  UNUSUAL VAGINAL DISCHARGE OR ITCHING   Items with * indicate a potential emergency and should be followed up as soon as possible or go to the Emergency Department if any problems should occur.  Please show the CHEMOTHERAPY ALERT CARD or IMMUNOTHERAPY ALERT CARD at check-in to the  Emergency Department and triage nurse.  Should you have questions after your visit or need to cancel or reschedule your appointment, please contact [facility]  Dept: [phone]  and follow the prompts.  Office hours are 8:00 a.m. to 4:30 p.m. Monday - Friday. Please note that voicemails left after 4:00 p.m. may not be returned until the following business day.  We are closed weekends and major holidays. You have access to a nurse at all times for urgent questions. Please call the main number to the clinic Dept: [phone] and follow the prompts.  For any non-urgent questions, you may also contact your provider using MyChart. We now offer e-Visits for anyone 18 and older to request care online for non-urgent symptoms. For details visit [email or web address].   Also download the MyChart app! Go to the app store, search "MyChart", open the app, select [facility], and log in with your MyChart username and password.  Due to Covid, a mask is required upon entering the hospital/clinic. If you do not have a mask, one will be given to you upon arrival. For doctor visits, patients may have 1 support person aged 18 or older with them. For treatment visits, patients cannot have anyone with them due to current Covid guidelines and our immunocompromised population.    

## 2020-12-21 NOTE — Progress Notes (Signed)
1517: PT STABLE AT TIME OF DISCHARGE

## 2020-12-27 ENCOUNTER — Encounter: Payer: Self-pay | Admitting: Internal Medicine

## 2020-12-27 ENCOUNTER — Ambulatory Visit (INDEPENDENT_AMBULATORY_CARE_PROVIDER_SITE_OTHER): Payer: Medicare Other | Admitting: Internal Medicine

## 2020-12-27 ENCOUNTER — Other Ambulatory Visit: Payer: Self-pay

## 2020-12-27 VITALS — BP 146/82 | HR 100 | Ht 64.5 in | Wt 183.0 lb

## 2020-12-27 DIAGNOSIS — E119 Type 2 diabetes mellitus without complications: Secondary | ICD-10-CM | POA: Diagnosis not present

## 2020-12-27 DIAGNOSIS — E1165 Type 2 diabetes mellitus with hyperglycemia: Secondary | ICD-10-CM

## 2020-12-27 DIAGNOSIS — Z794 Long term (current) use of insulin: Secondary | ICD-10-CM

## 2020-12-27 LAB — POCT GLYCOSYLATED HEMOGLOBIN (HGB A1C): Hemoglobin A1C: 6.1 % — AB (ref 4.0–5.6)

## 2020-12-27 MED ORDER — TRESIBA FLEXTOUCH 200 UNIT/ML ~~LOC~~ SOPN
45.0000 [IU] | PEN_INJECTOR | Freq: Every day | SUBCUTANEOUS | 3 refills | Status: DC
Start: 2020-12-27 — End: 2021-02-23

## 2020-12-27 MED ORDER — OZEMPIC (1 MG/DOSE) 4 MG/3ML ~~LOC~~ SOPN
1.0000 mg | PEN_INJECTOR | SUBCUTANEOUS | 6 refills | Status: DC
Start: 2020-12-27 — End: 2021-01-08

## 2020-12-27 MED ORDER — NOVOLOG FLEXPEN 100 UNIT/ML ~~LOC~~ SOPN: PEN_INJECTOR | SUBCUTANEOUS | 3 refills | Status: DC

## 2020-12-27 MED ORDER — OZEMPIC (1 MG/DOSE) 4 MG/3ML ~~LOC~~ SOPN
1.0000 mg | PEN_INJECTOR | SUBCUTANEOUS | 6 refills | Status: DC
Start: 2020-12-27 — End: 2020-12-27

## 2020-12-27 NOTE — Progress Notes (Signed)
Name: Heather Waller  Age/ Sex: 70 y.o., female   MRN/ DOB: [id number], 11/05/1950     PCP: Charlynn Court, NP   Reason for Endocrinology Evaluation: Type 2 Diabetes Mellitus  Initial Endocrine Consultative Visit: 02/23/2020    PATIENT IDENTIFIER: Heather Waller is a 70 y.o. female with a past medical history of T2DM, HTN , Hx breast CA (status postlumpectomy 07/2020 )and dyslipidemia. The patient has followed with Endocrinology clinic since 02/23/2020 for consultative assistance with management of her diabetes.     DIABETIC HISTORY:  Heather Waller was diagnosed with DM yrs ago,Intolerant to Metformin due to diarrhea .  Her hemoglobin A1c has ranged from 10.7% in 2021, peaking at 10.8%  in 2022.   On her initial visit to our clinic she had an A1c of 10.8%, she was on [place] which we continued and added prandial insulin. SUBJECTIVE:   During the last visit (09/27/2020): A1c 9.0 % we continued Ozempic, and tresiba and added prandial insulin     Today (12/27/2020): Heather Waller is here for a follow up on diabetes management.  She checks her blood sugars 2 times daily. The patient has not had hypoglycemic episodes since the last clinic visit.   She is status post right lumpectomy secondary to breast cancer 07/2020 She is on dexamethasone 4 mg 2 tabs for 2 days before chemo  Q 3 weeks . She completed chemo cycles, she will start Herceptin and radiation   Denies nausea or vomiting   Spouse did not want to fill pt assistance forms   HOME DIABETES REGIMEN:  Ozempic 1 mg weekly- she continues with Ozempic 0.5 mg weekly  Tresiba 50units daily NovoLog 12 units      Statin: Declines (02/2020) ACE-I/ARB: Yes    Unable to Download:  [id number] [phone]  DIABETIC COMPLICATIONS: Microvascular complications:  Neuropathy- chemo related  Denies: CKD, retinopathy Last Eye Exam: Completed 2021  Macrovascular complications:   Denies: CAD, CVA, PVD   HISTORY:  Past Medical  History:  Past Medical History:  Diagnosis Date   Diabetes mellitus without complication ([place])    Hyperlipidemia    Hypertension    Past Surgical History: No past surgical history on file. Social History:  reports that she has never smoked. She has never used smokeless tobacco. She reports that she does not currently use alcohol. No history on file for drug use. Family History:  Family History  Problem Relation Age of Onset   Alzheimer's disease Mother    Hypertension Father      HOME MEDICATIONS: Allergies as of 12/27/2020       Reactions   Codeine    Iodine    Oxycodone         Medication List        Accurate as of December 27, 2020 10:58 AM. If you have any questions, ask your nurse or doctor.          anastrozole 1 MG tablet Commonly known as: ARIMIDEX Take 1 tablet (1 mg total) by mouth daily.   FreeStyle Libre 2 Sensor Misc 1 Device by Does not apply route as directed.   Insulin Pen Needle 32G X 4 MM Misc 1 Device by Does not apply route in the morning, at noon, in the evening, and at bedtime.   lidocaine-prilocaine cream Commonly known as: EMLA Apply 1 application topically as needed.   NovoLOG FlexPen 100 UNIT/ML FlexPen Generic drug: insulin aspart Max daily 60 units   Ozempic (1 MG/DOSE)  4 MG/3ML Sopn Generic drug: Semaglutide (1 MG/DOSE) Inject 1 mg into the skin once a week.   Tyler Aas FlexTouch 200 UNIT/ML FlexTouch Pen Generic drug: insulin degludec Inject 56 Units into the skin daily.   valsartan-hydrochlorothiazide 80-12.5 MG tablet Commonly known as: DIOVAN-HCT Take 1 tablet by mouth daily.         OBJECTIVE:   Vital Signs: BP (!) 146/82 (BP Location: Left Arm, Patient Position: Sitting, Cuff Size: Small)   Pulse 100   Ht 5' 4.5" (1.638 m)   Wt 183 lb (83 kg)   SpO2 96%   BMI 30.93 kg/m   Wt Readings from Last 3 Encounters:  12/27/20 183 lb (83 kg)  12/19/20 182 lb 4.8 oz (82.7 kg)  12/01/20 178 lb (80.7 kg)      Exam: General: Pt appears well and is in NAD  Lungs: Clear with good BS bilat   Heart: RRR   Abdomen: Normoactive bowel sounds, soft, nontender, without masses or organomegaly palpable  Extremities: Trace  pretibial edema.   Neuro: MS is good with appropriate affect, pt is alert and Ox3    DM foot exam: 05/24/2020     The skin of the feet is intact without sores or ulcerations. The pedal pulses are 2+ on right and 2+ on left. The sensation is intact to a screening 5.07, 10 gram monofilament bilaterally    DATA REVIEWED:  Lab Results  Component Value Date   HGBA1C 6.1 (A) 12/27/2020   HGBA1C 9.0 (A) 09/27/2020   HGBA1C 9.0 (A) 05/24/2020   Results for Waller, Heather Waller (MRN [id number]) as of 09/28/2020 20:18  Ref. Range 09/26/2020 00:00 09/27/2020 13:45  Sodium Latest Ref Range: 137 - 147  137   Potassium Latest Ref Range: 3.4 - 5.3  4.2   Chloride Latest Ref Range: 99 - 108  99   CO2 Latest Ref Range: 13 - 22  29 (A)   Glucose Unknown 227   BUN Latest Ref Range: 4 - 21  14   Creatinine Latest Ref Range: 0.5 - 1.1  0.5   Calcium Latest Ref Range: 8.7 - 10.7  9.3   Alkaline Phosphatase Latest Ref Range: 25 - 125  84   Albumin Latest Ref Range: 3.5 - 5.0  4.0   AST Latest Ref Range: 13 - 35  28   ALT Latest Ref Range: 7 - 35  22   Bilirubin, Total Unknown 0.2   WBC Unknown 11.9   RBC Latest Ref Range: 3.87 - 5.11  4.53   Hemoglobin Latest Ref Range: 12.0 - 16.0  13.8   HCT Latest Ref Range: 36 - 46  41   Platelets Latest Ref Range: 150 - 399  277   NEUT# Unknown 8.21   Hemoglobin A1C Latest Ref Range: 4.0 - 5.6 %  9.0 (A)     ASSESSMENT / PLAN / RECOMMENDATIONS:   1) Type 2 Diabetes Mellitus, Optimally controlled, With out complications - Most recent A1c of 6.1%. Goal A1c < 7.0 %.    -I have praised the patient on improved glycemic control despite ongoing cancer/chemotherapy treatment -I will adjust her insulin as below -Today she was trained on freestyle libre  use, I have encouraged her to use it -She is in the donut hole, per patient this past did not believe they would qualify for patient assistance program, so the forms were not completed  MEDICATIONS: Increase Ozempic 1 mg weekly  Decrease Tresiba to 45 units daily Continue NovoLog 12  units with each meal   EDUCATION / INSTRUCTIONS: BG monitoring instructions: Patient is instructed to check her blood sugars 3 times a day, before meals . Call [facility] if: BG persistently < 70  I reviewed the Rule of 15 for the treatment of hypoglycemia in detail with the patient. Literature supplied.   2) Diabetic complications:  Eye: Does not have known diabetic retinopathy.  Neuro/ Feet: Does not have known diabetic peripheral neuropathy .  Renal: Patient does not have known baseline CKD. She   is  on an ACEI/ARB at present.    F/U in 4 months    Signed electronically by: Mack Guise, MD  [facility] End[facility]  [facility] [address] [place], [place] [address] Phone: [phone] FAX: [phone]   CC: Charlynn Court, NP [address] [place] [address] Phone: [phone]  Fax: [phone]  Return to Endocrinology clinic as below: Future Appointments  Date Time Provider [place]  01/11/2021  1:00 PM [facility] INFUSION CHAIR 1 [facility] None  01/31/2021  1:00 PM [facility] INFUSION CHAIR 7 [facility] None  02/22/2021  1:00 PM [facility] INFUSION CHAIR 1 [facility] None  03/13/2021  1:30 PM [facility] None  03/13/2021  2:00 PM [name], MD [facility] None

## 2020-12-27 NOTE — Patient Instructions (Addendum)
Keep Up the Good Work ! Increase Ozempic 1 mg once weekly  Decrease Tresiba to 45  units daily  Continue Novolog 12 units with each meal      HOW TO TREAT LOW BLOOD SUGARS (Blood sugar LESS THAN 70 MG/DL) Please follow the RULE OF 15 for the treatment of hypoglycemia treatment (when your (blood sugars are less than 70 mg/dL)   STEP 1: Take 15 grams of carbohydrates when your blood sugar is low, which includes:  3-4 GLUCOSE TABS  OR 3-4 OZ OF JUICE OR REGULAR SODA OR ONE TUBE OF GLUCOSE GEL    STEP 2: RECHECK blood sugar in 15 MINUTES STEP 3: If your blood sugar is still low at the 15 minute recheck --> then, go back to STEP 1 and treat AGAIN with another 15 grams of carbohydrates

## 2020-12-29 DIAGNOSIS — E119 Type 2 diabetes mellitus without complications: Secondary | ICD-10-CM | POA: Insufficient documentation

## 2020-12-29 DIAGNOSIS — Z794 Long term (current) use of insulin: Secondary | ICD-10-CM | POA: Insufficient documentation

## 2020-12-29 HISTORY — DX: Long term (current) use of insulin: Z79.4

## 2020-12-29 HISTORY — DX: Long term (current) use of insulin: E11.9

## 2021-01-08 ENCOUNTER — Encounter: Payer: Self-pay | Admitting: Internal Medicine

## 2021-01-08 ENCOUNTER — Other Ambulatory Visit: Payer: Self-pay | Admitting: Internal Medicine

## 2021-01-08 MED ORDER — OZEMPIC (0.25 OR 0.5 MG/DOSE) 2 MG/1.5ML ~~LOC~~ SOPN: 0.5000 mg | PEN_INJECTOR | SUBCUTANEOUS | 1 refills | Status: DC

## 2021-01-10 MED FILL — Trastuzumab-anns For IV Soln 150 MG: INTRAVENOUS | Qty: 22 | Status: AC

## 2021-01-11 ENCOUNTER — Other Ambulatory Visit: Payer: Self-pay

## 2021-01-11 ENCOUNTER — Inpatient Hospital Stay: Payer: Medicare Other | Attending: Oncology

## 2021-01-11 VITALS — BP 147/64 | HR 100 | Temp 98.1°F | Resp 18 | Ht 64.5 in | Wt 177.1 lb

## 2021-01-11 DIAGNOSIS — C50411 Malignant neoplasm of upper-outer quadrant of right female breast: Secondary | ICD-10-CM | POA: Insufficient documentation

## 2021-01-11 DIAGNOSIS — Z5112 Encounter for antineoplastic immunotherapy: Secondary | ICD-10-CM | POA: Insufficient documentation

## 2021-01-11 DIAGNOSIS — Z79899 Other long term (current) drug therapy: Secondary | ICD-10-CM | POA: Insufficient documentation

## 2021-01-11 DIAGNOSIS — Z170 Estrogen receptor positive status [ER+]: Secondary | ICD-10-CM | POA: Insufficient documentation

## 2021-01-11 MED ORDER — SODIUM CHLORIDE 0.9 % IV SOLN: Freq: Once | INTRAVENOUS | Status: AC

## 2021-01-11 MED ORDER — ACETAMINOPHEN 325 MG PO TABS: 650.0000 mg | ORAL_TABLET | Freq: Once | ORAL | Status: DC

## 2021-01-11 MED ORDER — ALTEPLASE 2 MG IJ SOLR
2.0000 mg | Freq: Once | INTRAMUSCULAR | Status: AC | PRN
  Administered 2021-01-11: 2 mg
  Filled 2021-01-11: qty 2

## 2021-01-11 MED ORDER — HEPARIN SOD (PORK) LOCK FLUSH 100 UNIT/ML IV SOLN
500.0000 [IU] | Freq: Once | INTRAVENOUS | Status: AC | PRN
  Administered 2021-01-11: 500 [IU]

## 2021-01-11 MED ORDER — TRASTUZUMAB-ANNS CHEMO 150 MG IV SOLR
6.0000 mg/kg | Freq: Once | INTRAVENOUS | Status: AC
  Administered 2021-01-11: 462 mg via INTRAVENOUS
  Filled 2021-01-11: qty 22

## 2021-01-11 MED ORDER — DIPHENHYDRAMINE HCL 25 MG PO CAPS: 50.0000 mg | ORAL_CAPSULE | Freq: Once | ORAL | Status: DC

## 2021-01-11 MED ORDER — SODIUM CHLORIDE 0.9% FLUSH
10.0000 mL | INTRAVENOUS | Status: DC | PRN
  Administered 2021-01-11: 10 mL

## 2021-01-11 NOTE — Patient Instructions (Signed)
[facility]  Discharge Instructions: Thank you for choosing [facility] to provide your oncology and hematology care.  If you have a lab appointment with the Cancer Center, please go directly to the Cancer Center and check in at the registration area.   Wear comfortable clothing and clothing appropriate for easy access to any Portacath or PICC line.   We strive to give you quality time with your provider. You may need to reschedule your appointment if you arrive late (15 or more minutes).  Arriving late affects you and other patients whose appointments are after yours.  Also, if you miss three or more appointments without notifying the office, you may be dismissed from the clinic at the provider's discretion.      For prescription refill requests, have your pharmacy contact our office and allow 72 hours for refills to be completed.    Today you received the following chemotherapy and/or immunotherapy agents Trastuzumab     To help prevent nausea and vomiting after your treatment, we encourage you to take your nausea medication as directed.  BELOW ARE SYMPTOMS THAT SHOULD BE REPORTED IMMEDIATELY: *FEVER GREATER THAN 100.4 F (38 C) OR HIGHER *CHILLS OR SWEATING *NAUSEA AND VOMITING THAT IS NOT CONTROLLED WITH YOUR NAUSEA MEDICATION *UNUSUAL SHORTNESS OF BREATH *UNUSUAL BRUISING OR BLEEDING *URINARY PROBLEMS (pain or burning when urinating, or frequent urination) *BOWEL PROBLEMS (unusual diarrhea, constipation, pain near the anus) TENDERNESS IN MOUTH AND THROAT WITH OR WITHOUT PRESENCE OF ULCERS (sore throat, sores in mouth, or a toothache) UNUSUAL RASH, SWELLING OR PAIN  UNUSUAL VAGINAL DISCHARGE OR ITCHING   Items with * indicate a potential emergency and should be followed up as soon as possible or go to the Emergency Department if any problems should occur.  Please show the CHEMOTHERAPY ALERT CARD or IMMUNOTHERAPY ALERT CARD at check-in to the  Emergency Department and triage nurse.  Should you have questions after your visit or need to cancel or reschedule your appointment, please contact [facility]  Dept: [phone]  and follow the prompts.  Office hours are 8:00 a.m. to 4:30 p.m. Monday - Friday. Please note that voicemails left after 4:00 p.m. may not be returned until the following business day.  We are closed weekends and major holidays. You have access to a nurse at all times for urgent questions. Please call the main number to the clinic Dept: [phone] and follow the prompts.  For any non-urgent questions, you may also contact your provider using MyChart. We now offer e-Visits for anyone 18 and older to request care online for non-urgent symptoms. For details visit [email or web address].   Also download the MyChart app! Go to the app store, search "MyChart", open the app, select [facility], and log in with your MyChart username and password.  Due to Covid, a mask is required upon entering the hospital/clinic. If you do not have a mask, one will be given to you upon arrival. For doctor visits, patients may have 1 support person aged 18 or older with them. For treatment visits, patients cannot have anyone with them due to current Covid guidelines and our immunocompromised population.    

## 2021-01-11 NOTE — Progress Notes (Signed)
1523:PT STABLE AT TIME OF DISCHARGE

## 2021-01-12 ENCOUNTER — Encounter: Payer: Self-pay | Admitting: Oncology

## 2021-01-30 ENCOUNTER — Encounter: Payer: Self-pay | Admitting: Oncology

## 2021-01-31 ENCOUNTER — Other Ambulatory Visit: Payer: Self-pay

## 2021-01-31 ENCOUNTER — Inpatient Hospital Stay: Payer: Medicare Other

## 2021-01-31 VITALS — BP 134/61 | HR 95 | Temp 98.7°F | Resp 18 | Ht 64.5 in | Wt 174.5 lb

## 2021-01-31 DIAGNOSIS — C50411 Malignant neoplasm of upper-outer quadrant of right female breast: Secondary | ICD-10-CM

## 2021-01-31 DIAGNOSIS — Z5112 Encounter for antineoplastic immunotherapy: Secondary | ICD-10-CM | POA: Diagnosis not present

## 2021-01-31 MED ORDER — DIPHENHYDRAMINE HCL 25 MG PO CAPS: 25.0000 mg | ORAL_CAPSULE | Freq: Once | ORAL | Status: DC

## 2021-01-31 MED ORDER — TRASTUZUMAB-ANNS CHEMO 150 MG IV SOLR
6.0000 mg/kg | Freq: Once | INTRAVENOUS | Status: AC
  Administered 2021-01-31: 462 mg via INTRAVENOUS
  Filled 2021-01-31: qty 22

## 2021-01-31 MED ORDER — SODIUM CHLORIDE 0.9 % IV SOLN: Freq: Once | INTRAVENOUS | Status: AC

## 2021-01-31 MED ORDER — HEPARIN SOD (PORK) LOCK FLUSH 100 UNIT/ML IV SOLN
500.0000 [IU] | Freq: Once | INTRAVENOUS | Status: AC | PRN
  Administered 2021-01-31: 500 [IU]

## 2021-01-31 MED ORDER — ACETAMINOPHEN 325 MG PO TABS: 650.0000 mg | ORAL_TABLET | Freq: Once | ORAL | Status: DC

## 2021-01-31 MED ORDER — SODIUM CHLORIDE 0.9% FLUSH
10.0000 mL | INTRAVENOUS | Status: DC | PRN
  Administered 2021-01-31: 10 mL

## 2021-01-31 NOTE — Patient Instructions (Signed)
[facility]  Discharge Instructions: Thank you for choosing [facility] to provide your oncology and hematology care.  If you have a lab appointment with the Cancer Center, please go directly to the Cancer Center and check in at the registration area.   Wear comfortable clothing and clothing appropriate for easy access to any Portacath or PICC line.   We strive to give you quality time with your provider. You may need to reschedule your appointment if you arrive late (15 or more minutes).  Arriving late affects you and other patients whose appointments are after yours.  Also, if you miss three or more appointments without notifying the office, you may be dismissed from the clinic at the provider's discretion.      For prescription refill requests, have your pharmacy contact our office and allow 72 hours for refills to be completed.    Today you received the following chemotherapy and/or immunotherapy agents Trastuzumab     To help prevent nausea and vomiting after your treatment, we encourage you to take your nausea medication as directed.  BELOW ARE SYMPTOMS THAT SHOULD BE REPORTED IMMEDIATELY: *FEVER GREATER THAN 100.4 F (38 C) OR HIGHER *CHILLS OR SWEATING *NAUSEA AND VOMITING THAT IS NOT CONTROLLED WITH YOUR NAUSEA MEDICATION *UNUSUAL SHORTNESS OF BREATH *UNUSUAL BRUISING OR BLEEDING *URINARY PROBLEMS (pain or burning when urinating, or frequent urination) *BOWEL PROBLEMS (unusual diarrhea, constipation, pain near the anus) TENDERNESS IN MOUTH AND THROAT WITH OR WITHOUT PRESENCE OF ULCERS (sore throat, sores in mouth, or a toothache) UNUSUAL RASH, SWELLING OR PAIN  UNUSUAL VAGINAL DISCHARGE OR ITCHING   Items with * indicate a potential emergency and should be followed up as soon as possible or go to the Emergency Department if any problems should occur.  Please show the CHEMOTHERAPY ALERT CARD or IMMUNOTHERAPY ALERT CARD at check-in to the  Emergency Department and triage nurse.  Should you have questions after your visit or need to cancel or reschedule your appointment, please contact [facility]  Dept: [phone]  and follow the prompts.  Office hours are 8:00 a.m. to 4:30 p.m. Monday - Friday. Please note that voicemails left after 4:00 p.m. may not be returned until the following business day.  We are closed weekends and major holidays. You have access to a nurse at all times for urgent questions. Please call the main number to the clinic Dept: [phone] and follow the prompts.  For any non-urgent questions, you may also contact your provider using MyChart. We now offer e-Visits for anyone 18 and older to request care online for non-urgent symptoms. For details visit [email or web address].   Also download the MyChart app! Go to the app store, search "MyChart", open the app, select [facility], and log in with your MyChart username and password.  Due to Covid, a mask is required upon entering the hospital/clinic. If you do not have a mask, one will be given to you upon arrival. For doctor visits, patients may have 1 support person aged 18 or older with them. For treatment visits, patients cannot have anyone with them due to current Covid guidelines and our immunocompromised population.    

## 2021-01-31 NOTE — Progress Notes (Signed)
1413:PT STABLE AT TIME OF DISCHARGE ?

## 2021-02-21 MED FILL — Trastuzumab-anns For IV Soln 150 MG: INTRAVENOUS | Qty: 22 | Status: AC

## 2021-02-22 ENCOUNTER — Encounter: Payer: Self-pay | Admitting: Internal Medicine

## 2021-02-22 ENCOUNTER — Inpatient Hospital Stay: Payer: Medicare Other | Attending: Oncology

## 2021-02-22 ENCOUNTER — Other Ambulatory Visit: Payer: Self-pay

## 2021-02-22 DIAGNOSIS — C50411 Malignant neoplasm of upper-outer quadrant of right female breast: Secondary | ICD-10-CM | POA: Insufficient documentation

## 2021-02-22 DIAGNOSIS — Z170 Estrogen receptor positive status [ER+]: Secondary | ICD-10-CM | POA: Insufficient documentation

## 2021-02-22 DIAGNOSIS — Z9221 Personal history of antineoplastic chemotherapy: Secondary | ICD-10-CM | POA: Insufficient documentation

## 2021-02-22 DIAGNOSIS — Z79811 Long term (current) use of aromatase inhibitors: Secondary | ICD-10-CM | POA: Insufficient documentation

## 2021-02-22 DIAGNOSIS — L589 Radiodermatitis, unspecified: Secondary | ICD-10-CM | POA: Diagnosis not present

## 2021-02-22 DIAGNOSIS — Z79899 Other long term (current) drug therapy: Secondary | ICD-10-CM

## 2021-02-22 DIAGNOSIS — Z5112 Encounter for antineoplastic immunotherapy: Secondary | ICD-10-CM | POA: Diagnosis present

## 2021-02-22 MED ORDER — TRASTUZUMAB-ANNS CHEMO 150 MG IV SOLR
6.0000 mg/kg | Freq: Once | INTRAVENOUS | Status: AC
  Administered 2021-02-22: 462 mg via INTRAVENOUS
  Filled 2021-02-22: qty 22

## 2021-02-22 MED ORDER — ACETAMINOPHEN 325 MG PO TABS: 650.0000 mg | ORAL_TABLET | Freq: Once | ORAL | Status: DC

## 2021-02-22 MED ORDER — DIPHENHYDRAMINE HCL 25 MG PO CAPS: 25.0000 mg | ORAL_CAPSULE | Freq: Once | ORAL | Status: DC

## 2021-02-22 MED ORDER — SODIUM CHLORIDE 0.9 % IV SOLN: Freq: Once | INTRAVENOUS | Status: AC

## 2021-02-22 MED ORDER — SODIUM CHLORIDE 0.9% FLUSH
10.0000 mL | INTRAVENOUS | Status: DC | PRN
  Administered 2021-02-22: 10 mL

## 2021-02-22 MED ORDER — HEPARIN SOD (PORK) LOCK FLUSH 100 UNIT/ML IV SOLN
500.0000 [IU] | Freq: Once | INTRAVENOUS | Status: AC | PRN
  Administered 2021-02-22: 500 [IU]

## 2021-02-22 NOTE — Patient Instructions (Signed)
[facility]  Discharge Instructions: Thank you for choosing [facility] to provide your oncology and hematology care.  If you have a lab appointment with the [facility], please go directly to the [facility] and check in at the registration area.   Wear comfortable clothing and clothing appropriate for easy access to any Portacath or PICC line.   We strive to give you quality time with your provider. You may need to reschedule your appointment if you arrive late (15 or more minutes).  Arriving late affects you and other patients whose appointments are after yours.  Also, if you miss three or more appointments without notifying the office, you may be dismissed from the clinic at the providers discretion.      For prescription refill requests, have your pharmacy contact our office and allow 72 hours for refills to be completed.    Today you received the following chemotherapy and/or immunotherapy agents trasuzumab    To help prevent nausea and vomiting after your treatment, we encourage you to take your nausea medication as directed.  BELOW ARE SYMPTOMS THAT SHOULD BE REPORTED IMMEDIATELY: *FEVER GREATER THAN 100.4 F (38 C) OR HIGHER *CHILLS OR SWEATING *NAUSEA AND VOMITING THAT IS NOT CONTROLLED WITH YOUR NAUSEA MEDICATION *UNUSUAL SHORTNESS OF BREATH *UNUSUAL BRUISING OR BLEEDING *URINARY PROBLEMS (pain or burning when urinating, or frequent urination) *BOWEL PROBLEMS (unusual diarrhea, constipation, pain near the anus) TENDERNESS IN MOUTH AND THROAT WITH OR WITHOUT PRESENCE OF ULCERS (sore throat, sores in mouth, or a toothache) UNUSUAL RASH, SWELLING OR PAIN  UNUSUAL VAGINAL DISCHARGE OR ITCHING   Items with * indicate a potential emergency and should be followed up as soon as possible or go to the Emergency Department if any problems should occur.  Please show the CHEMOTHERAPY ALERT CARD or IMMUNOTHERAPY ALERT CARD at check-in to the  Emergency Department and triage nurse.  Should you have questions after your visit or need to cancel or reschedule your appointment, please contact [facility]  Dept: [phone]  and follow the prompts.  Office hours are 8:00 a.m. to 4:30 p.m. Monday - Friday. Please note that voicemails left after 4:00 p.m. may not be returned until the following business day.  We are closed weekends and major holidays. You have access to a nurse at all times for urgent questions. Please call the main number to the clinic Dept: [phone] and follow the prompts.  For any non-urgent questions, you may also contact your provider using MyChart. We now offer e-Visits for anyone 73 and older to request care online for non-urgent symptoms. For details visit [email or web address].   Also download the MyChart app! Go to the app store, search "MyChart", open the app, select [facility], and log in with your MyChart username and password.  Due to Covid, a mask is required upon entering the hospital/clinic. If you do not have a mask, one will be given to you upon arrival. For doctor visits, patients may have 1 support person aged 73 or older with them. For treatment visits, patients cannot have anyone with them due to current Covid guidelines and our immunocompromised population.   Trastuzumab injection for infusion What is this medication? TRASTUZUMAB (tras TOO zoo mab) is a monoclonal antibody. It is used to treat breast cancer and stomach cancer. This medicine may be used for other purposes; ask your health care provider or pharmacist if you have questions. COMMON BRAND NAME(S): Herceptin, Belenda Cruise, Ogivri,  Chalmers Guest What should I tell my care team before I take this medication? They need to know if you have any of these conditions: heart disease heart failure lung or breathing disease, like asthma an unusual or allergic reaction to trastuzumab, benzyl alcohol, or other  medications, foods, dyes, or preservatives pregnant or trying to get pregnant breast-feeding How should I use this medication? This drug is given as an infusion into a vein. It is administered in a hospital or clinic by a specially trained health care professional. Talk to your pediatrician regarding the use of this medicine in children. This medicine is not approved for use in children. Overdosage: If you think you have taken too much of this medicine contact a poison control center or emergency room at once. NOTE: This medicine is only for you. Do not share this medicine with others. What if I miss a dose? It is important not to miss a dose. Call your doctor or health care professional if you are unable to keep an appointment. What may interact with this medication? This medicine may interact with the following medications: certain types of chemotherapy, such as daunorubicin, doxorubicin, epirubicin, and idarubicin This list may not describe all possible interactions. Give your health care provider a list of all the medicines, herbs, non-prescription drugs, or dietary supplements you use. Also tell them if you smoke, drink alcohol, or use illegal drugs. Some items may interact with your medicine. What should I watch for while using this medication? Visit your doctor for checks on your progress. Report any side effects. Continue your course of treatment even though you feel ill unless your doctor tells you to stop. Call your doctor or health care professional for advice if you get a fever, chills or sore throat, or other symptoms of a cold or flu. Do not treat yourself. Try to avoid being around people who are sick. You may experience fever, chills and shaking during your first infusion. These effects are usually mild and can be treated with other medicines. Report any side effects during the infusion to your health care professional. Fever and chills usually do not happen with later infusions. Do  not become pregnant while taking this medicine or for 7 months after stopping it. Women should inform their doctor if they wish to become pregnant or think they might be pregnant. Women of child-bearing potential will need to have a negative pregnancy test before starting this medicine. There is a potential for serious side effects to an unborn child. Talk to your health care professional or pharmacist for more information. Do not breast-feed an infant while taking this medicine or for 7 months after stopping it. Women must use effective birth control with this medicine. What side effects may I notice from receiving this medication? Side effects that you should report to your doctor or health care professional as soon as possible: allergic reactions like skin rash, itching or hives, swelling of the face, lips, or tongue chest pain or palpitations cough dizziness feeling faint or lightheaded, falls fever general ill feeling or flu-like symptoms signs of worsening heart failure like breathing problems; swelling in your legs and feet unusually weak or tired Side effects that usually do not require medical attention (report to your doctor or health care professional if they continue or are bothersome): bone pain changes in taste diarrhea joint pain nausea/vomiting weight loss This list may not describe all possible side effects. Call your doctor for medical advice about side effects. You may report side  effects to FDA at 1-800-FDA-1088. Where should I keep my medication? This drug is given in a hospital or clinic and will not be stored at home. NOTE: This sheet is a summary. It may not cover all possible information. If you have questions about this medicine, talk to your doctor, pharmacist, or health care provider.  2022 Elsevier/Gold Standard (2016-02-13 00:00:00)

## 2021-02-23 ENCOUNTER — Other Ambulatory Visit: Payer: Self-pay

## 2021-02-23 MED ORDER — TRESIBA FLEXTOUCH 200 UNIT/ML ~~LOC~~ SOPN: PEN_INJECTOR | SUBCUTANEOUS | 3 refills | Status: DC

## 2021-03-07 NOTE — Progress Notes (Signed)
[facility]  [address] [place],  [name]  [address] [phone]  Clinic Day:  03/13/2021  Referring physician: Charlynn Court, NP  This document serves as a record of services personally performed by [name], MD. It was created on their behalf by Curry,Lauren E, a trained medical scribe. The creation of this record is based on the scribe's personal observations and the provider's statements to them.  HISTORY OF PRESENT ILLNESS:  The patient is a 71 y.o. female with stage IA (T1c N0 M0) hormone/Her2 positive right breast cancer, status post a lumpectomy in June 2022.  She completed 6 cycles of adjuvant TCH chemotherapy in October 2022. She continues to take maintenance Herceptin to complete her 1 full year of anti-Her2 therapy. The patient claims to have tolerated her maintenance Herceptin very well.  Her only problem today is the skin breakdown under her right breast from where she has been receiving radiation.  She is seriously contemplating discontinuing further doses of breast radiation.  Otherwise, from a breast cancer standpoint, she denies having any particular changes in her right breast or elsewhere which concern her for early disease recurrence.  VITALS:  Blood pressure (!) 179/79, pulse 96, temperature 98.1 F (36.7 C), resp. rate 16, height 5' 4.5" (1.638 m), weight 173 lb 12.8 oz (78.8 kg), SpO2 95 %.  Wt Readings from Last 3 Encounters:  03/13/21 173 lb 12.8 oz (78.8 kg)  01/31/21 174 lb 8 oz (79.2 kg)  01/11/21 177 lb 1.9 oz (80.3 kg)    Body mass index is 29.37 kg/m.  Performance status (ECOG): 1  PHYSICAL EXAM:  Physical Exam Constitutional:      General: She is not in acute distress.    Appearance: Normal appearance. She is normal weight.  HENT:     Head: Normocephalic and atraumatic.     Mouth/Throat:     Pharynx: Oropharynx is clear. No oropharyngeal exudate.  Eyes:     General: No scleral icterus.    Extraocular  Movements: Extraocular movements intact.     Conjunctiva/sclera: Conjunctivae normal.     Pupils: Pupils are equal, round, and reactive to light.  Cardiovascular:     Rate and Rhythm: Normal rate and regular rhythm.     Pulses: Normal pulses.     Heart sounds: Normal heart sounds. No murmur heard.   No friction rub. No gallop.  Pulmonary:     Effort: Pulmonary effort is normal. No respiratory distress.     Breath sounds: Normal breath sounds.  Chest:  Breasts:    Right: Skin change (radiation-induced skin changes over right breast; 2-3 cm area of superficial skin breakdown under her right breast; no purulence appreciated) present. No swelling, bleeding, inverted nipple, mass or nipple discharge.     Left: No swelling, bleeding, inverted nipple, mass, nipple discharge or skin change.  Abdominal:     General: Bowel sounds are normal. There is no distension.     Palpations: Abdomen is soft. There is no hepatomegaly, splenomegaly or mass.     Tenderness: There is no abdominal tenderness.  Musculoskeletal:        General: No tenderness. Normal range of motion.     Cervical back: Normal range of motion and neck supple.     Right lower leg: No edema.     Left lower leg: No edema.  Lymphadenopathy:     Cervical: No cervical adenopathy.     Right cervical: No superficial, deep or posterior  cervical adenopathy.    Left cervical: No superficial, deep or posterior cervical adenopathy.     Upper Body:     Right upper body: No supraclavicular or axillary adenopathy.     Left upper body: No supraclavicular or axillary adenopathy.     Lower Body: No right inguinal adenopathy. No left inguinal adenopathy.  Skin:    General: Skin is warm and dry.     Coloration: Skin is not jaundiced.     Findings: No lesion or rash.  Neurological:     General: No focal deficit present.     Mental Status: She is alert and oriented to person, place, and time. Mental status is at baseline.  Psychiatric:         Mood and Affect: Mood normal.        Behavior: Behavior normal.        Thought Content: Thought content normal.        Judgment: Judgment normal.   ASSESSMENT & PLAN:  Assessment/Plan:  A  71 y.o. female with stage IA (T1c N0 M0) hormone/Her2 positive breast cancer. She will continue with her maintenance Herceptin to complete her 1 full year of anti-Her2 therapy.  She also knows to continue taking her anastrozole for her adjuvant endocrine therapy.  With respect to the minor skin breakdown under her right breast, it does not look particularly worrisome.  As mentioned previously, she is contemplating discontinuing all future doses of radiation.  I have told her to speak with radiation oncology to come to a consensus that is what they would do next moving forward.  Overall, she appears to be doing fine.  I will see her back in 4 months for repeat clinical assessment.  The patient understands all the plans discussed today and is in agreement with them.    I, [name], am acting as scribe for [name], MD    I have reviewed this report as typed by the medical scribe, and it is complete and accurate.  Dequincy Macarthur Critchley, MD

## 2021-03-09 DIAGNOSIS — C50411 Malignant neoplasm of upper-outer quadrant of right female breast: Secondary | ICD-10-CM | POA: Diagnosis not present

## 2021-03-09 DIAGNOSIS — T451X1D Poisoning by antineoplastic and immunosuppressive drugs, accidental (unintentional), subsequent encounter: Secondary | ICD-10-CM | POA: Diagnosis not present

## 2021-03-13 ENCOUNTER — Other Ambulatory Visit: Payer: Self-pay | Admitting: Hematology and Oncology

## 2021-03-13 ENCOUNTER — Inpatient Hospital Stay: Payer: Medicare Other

## 2021-03-13 ENCOUNTER — Inpatient Hospital Stay (INDEPENDENT_AMBULATORY_CARE_PROVIDER_SITE_OTHER): Payer: Medicare Other | Admitting: Oncology

## 2021-03-13 ENCOUNTER — Other Ambulatory Visit: Payer: Self-pay

## 2021-03-13 VITALS — BP 179/79 | HR 96 | Temp 98.1°F | Resp 16 | Ht 64.5 in | Wt 173.8 lb

## 2021-03-13 DIAGNOSIS — C50411 Malignant neoplasm of upper-outer quadrant of right female breast: Secondary | ICD-10-CM | POA: Diagnosis not present

## 2021-03-13 DIAGNOSIS — Z170 Estrogen receptor positive status [ER+]: Secondary | ICD-10-CM

## 2021-03-13 LAB — BASIC METABOLIC PANEL
BUN: 13 (ref 4–21)
CO2: 28 — AB (ref 13–22)
Chloride: 99 (ref 99–108)
Creatinine: 0.4 — AB (ref 0.5–1.1)
Glucose: 161
Potassium: 3.9 (ref 3.4–5.3)
Sodium: 135 — AB (ref 137–147)

## 2021-03-13 LAB — CBC AND DIFFERENTIAL
HCT: 42 (ref 36–46)
Hemoglobin: 14.6 (ref 12.0–16.0)
Neutrophils Absolute: 5.06
Platelets: 214 (ref 150–399)
WBC: 7.9

## 2021-03-13 LAB — CBC: RBC: 4.68 (ref 3.87–5.11)

## 2021-03-13 LAB — COMPREHENSIVE METABOLIC PANEL
Albumin: 4.2 (ref 3.5–5.0)
Calcium: 9.5 (ref 8.7–10.7)

## 2021-03-13 LAB — HEPATIC FUNCTION PANEL
ALT: 20 (ref 7–35)
AST: 21 (ref 13–35)
Alkaline Phosphatase: 72 (ref 25–125)
Bilirubin, Total: 0.3

## 2021-03-13 LAB — MAGNESIUM: Magnesium: 1.8

## 2021-03-14 MED FILL — Trastuzumab-anns For IV Soln 150 MG: INTRAVENOUS | Qty: 22 | Status: AC

## 2021-03-15 ENCOUNTER — Inpatient Hospital Stay: Payer: Medicare Other | Attending: Oncology

## 2021-03-15 ENCOUNTER — Other Ambulatory Visit: Payer: Self-pay

## 2021-03-15 VITALS — BP 156/67 | HR 95 | Temp 97.4°F | Resp 18 | Wt 176.0 lb

## 2021-03-15 DIAGNOSIS — C50411 Malignant neoplasm of upper-outer quadrant of right female breast: Secondary | ICD-10-CM | POA: Insufficient documentation

## 2021-03-15 DIAGNOSIS — Z5112 Encounter for antineoplastic immunotherapy: Secondary | ICD-10-CM | POA: Insufficient documentation

## 2021-03-15 DIAGNOSIS — Z170 Estrogen receptor positive status [ER+]: Secondary | ICD-10-CM

## 2021-03-15 MED ORDER — SODIUM CHLORIDE 0.9 % IV SOLN: Freq: Once | INTRAVENOUS | Status: AC

## 2021-03-15 MED ORDER — ALTEPLASE 2 MG IJ SOLR
2.0000 mg | Freq: Once | INTRAMUSCULAR | Status: AC | PRN
  Administered 2021-03-15: 2 mg
  Filled 2021-03-15: qty 2

## 2021-03-15 MED ORDER — SODIUM CHLORIDE 0.9% FLUSH
10.0000 mL | INTRAVENOUS | Status: DC | PRN
  Administered 2021-03-15: 10 mL

## 2021-03-15 MED ORDER — TRASTUZUMAB-ANNS CHEMO 150 MG IV SOLR
6.0000 mg/kg | Freq: Once | INTRAVENOUS | Status: AC
  Administered 2021-03-15: 462 mg via INTRAVENOUS
  Filled 2021-03-15: qty 22

## 2021-03-15 MED ORDER — HEPARIN SOD (PORK) LOCK FLUSH 100 UNIT/ML IV SOLN
500.0000 [IU] | Freq: Once | INTRAVENOUS | Status: AC | PRN
  Administered 2021-03-15: 500 [IU]

## 2021-03-15 MED ORDER — ACETAMINOPHEN 325 MG PO TABS: 650.0000 mg | ORAL_TABLET | Freq: Once | ORAL | Status: DC

## 2021-03-15 MED ORDER — DIPHENHYDRAMINE HCL 25 MG PO CAPS: 25.0000 mg | ORAL_CAPSULE | Freq: Once | ORAL | Status: DC

## 2021-03-15 NOTE — Progress Notes (Signed)
Therapy given by peripheral IV in order to meet expiration perimeters on Kanjinti. Port initially did not provide blood return- Alteplase given with positive blood return.

## 2021-03-15 NOTE — Patient Instructions (Signed)
Trastuzumab injection for infusion °What is this medication? °TRASTUZUMAB (tras TOO zoo mab) is a monoclonal antibody. It is used to treat breast cancer and stomach cancer. °This medicine may be used for other purposes; ask your health care provider or pharmacist if you have questions. °COMMON BRAND NAME(S): Herceptin, Herzuma, KANJINTI, Ogivri, Ontruzant, Trazimera °What should I tell my care team before I take this medication? °They need to know if you have any of these conditions: °heart disease °heart failure °lung or breathing disease, like asthma °an unusual or allergic reaction to trastuzumab, benzyl alcohol, or other medications, foods, dyes, or preservatives °pregnant or trying to get pregnant °breast-feeding °How should I use this medication? °This drug is given as an infusion into a vein. It is administered in a hospital or clinic by a specially trained health care professional. °Talk to your pediatrician regarding the use of this medicine in children. This medicine is not approved for use in children. °Overdosage: If you think you have taken too much of this medicine contact a poison control center or emergency room at once. °NOTE: This medicine is only for you. Do not share this medicine with others. °What if I miss a dose? °It is important not to miss a dose. Call your doctor or health care professional if you are unable to keep an appointment. °What may interact with this medication? °This medicine may interact with the following medications: °certain types of chemotherapy, such as daunorubicin, doxorubicin, epirubicin, and idarubicin °This list may not describe all possible interactions. Give your health care provider a list of all the medicines, herbs, non-prescription drugs, or dietary supplements you use. Also tell them if you smoke, drink alcohol, or use illegal drugs. Some items may interact with your medicine. °What should I watch for while using this medication? °Visit your doctor for checks  on your progress. Report any side effects. Continue your course of treatment even though you feel ill unless your doctor tells you to stop. °Call your doctor or health care professional for advice if you get a fever, chills or sore throat, or other symptoms of a cold or flu. Do not treat yourself. Try to avoid being around people who are sick. °You may experience fever, chills and shaking during your first infusion. These effects are usually mild and can be treated with other medicines. Report any side effects during the infusion to your health care professional. Fever and chills usually do not happen with later infusions. °Do not become pregnant while taking this medicine or for 7 months after stopping it. Women should inform their doctor if they wish to become pregnant or think they might be pregnant. Women of child-bearing potential will need to have a negative pregnancy test before starting this medicine. There is a potential for serious side effects to an unborn child. Talk to your health care professional or pharmacist for more information. Do not breast-feed an infant while taking this medicine or for 7 months after stopping it. °Women must use effective birth control with this medicine. °What side effects may I notice from receiving this medication? °Side effects that you should report to your doctor or health care professional as soon as possible: °allergic reactions like skin rash, itching or hives, swelling of the face, lips, or tongue °chest pain or palpitations °cough °dizziness °feeling faint or lightheaded, falls °fever °general ill feeling or flu-like symptoms °signs of worsening heart failure like breathing problems; swelling in your legs and feet °unusually weak or tired °Side effects that usually   do not require medical attention (report to your doctor or health care professional if they continue or are bothersome): °bone pain °changes in taste °diarrhea °joint pain °nausea/vomiting °weight  loss °This list may not describe all possible side effects. Call your doctor for medical advice about side effects. You may report side effects to FDA at 1-800-FDA-1088. °Where should I keep my medication? °This drug is given in a hospital or clinic and will not be stored at home. °NOTE: This sheet is a summary. It may not cover all possible information. If you have questions about this medicine, talk to your doctor, pharmacist, or health care provider. °© 2022 Elsevier/Gold Standard (2016-02-13 00:00:00) ° °

## 2021-03-16 ENCOUNTER — Encounter: Payer: Self-pay | Admitting: Oncology

## 2021-03-19 ENCOUNTER — Encounter: Payer: Self-pay | Admitting: Oncology

## 2021-04-02 ENCOUNTER — Encounter: Payer: Self-pay | Admitting: Internal Medicine

## 2021-04-04 MED FILL — Trastuzumab-anns For IV Soln 150 MG: INTRAVENOUS | Qty: 22 | Status: AC

## 2021-04-05 ENCOUNTER — Inpatient Hospital Stay: Payer: Medicare Other

## 2021-04-05 ENCOUNTER — Other Ambulatory Visit: Payer: Self-pay

## 2021-04-05 VITALS — BP 176/77 | HR 97 | Temp 97.8°F | Resp 18 | Ht 64.5 in | Wt 176.0 lb

## 2021-04-05 DIAGNOSIS — Z170 Estrogen receptor positive status [ER+]: Secondary | ICD-10-CM

## 2021-04-05 DIAGNOSIS — Z5112 Encounter for antineoplastic immunotherapy: Secondary | ICD-10-CM | POA: Diagnosis not present

## 2021-04-05 DIAGNOSIS — C50411 Malignant neoplasm of upper-outer quadrant of right female breast: Secondary | ICD-10-CM

## 2021-04-05 MED ORDER — DIPHENHYDRAMINE HCL 25 MG PO CAPS: 25.0000 mg | ORAL_CAPSULE | Freq: Once | ORAL | Status: DC

## 2021-04-05 MED ORDER — HEPARIN SOD (PORK) LOCK FLUSH 100 UNIT/ML IV SOLN
500.0000 [IU] | Freq: Once | INTRAVENOUS | Status: AC | PRN
  Administered 2021-04-05: 500 [IU]

## 2021-04-05 MED ORDER — SODIUM CHLORIDE 0.9% FLUSH
10.0000 mL | INTRAVENOUS | Status: DC | PRN
  Administered 2021-04-05: 10 mL

## 2021-04-05 MED ORDER — SODIUM CHLORIDE 0.9 % IV SOLN: Freq: Once | INTRAVENOUS | Status: AC

## 2021-04-05 MED ORDER — ACETAMINOPHEN 325 MG PO TABS: 650.0000 mg | ORAL_TABLET | Freq: Once | ORAL | Status: DC

## 2021-04-05 MED ORDER — TRASTUZUMAB-ANNS CHEMO 150 MG IV SOLR
6.0000 mg/kg | Freq: Once | INTRAVENOUS | Status: AC
  Administered 2021-04-05: 462 mg via INTRAVENOUS
  Filled 2021-04-05: qty 22

## 2021-04-05 NOTE — Patient Instructions (Signed)
Trastuzumab injection for infusion °What is this medication? °TRASTUZUMAB (tras TOO zoo mab) is a monoclonal antibody. It is used to treat breast cancer and stomach cancer. °This medicine may be used for other purposes; ask your health care provider or pharmacist if you have questions. °COMMON BRAND NAME(S): Herceptin, Herzuma, KANJINTI, Ogivri, Ontruzant, Trazimera °What should I tell my care team before I take this medication? °They need to know if you have any of these conditions: °heart disease °heart failure °lung or breathing disease, like asthma °an unusual or allergic reaction to trastuzumab, benzyl alcohol, or other medications, foods, dyes, or preservatives °pregnant or trying to get pregnant °breast-feeding °How should I use this medication? °This drug is given as an infusion into a vein. It is administered in a hospital or clinic by a specially trained health care professional. °Talk to your pediatrician regarding the use of this medicine in children. This medicine is not approved for use in children. °Overdosage: If you think you have taken too much of this medicine contact a poison control center or emergency room at once. °NOTE: This medicine is only for you. Do not share this medicine with others. °What if I miss a dose? °It is important not to miss a dose. Call your doctor or health care professional if you are unable to keep an appointment. °What may interact with this medication? °This medicine may interact with the following medications: °certain types of chemotherapy, such as daunorubicin, doxorubicin, epirubicin, and idarubicin °This list may not describe all possible interactions. Give your health care provider a list of all the medicines, herbs, non-prescription drugs, or dietary supplements you use. Also tell them if you smoke, drink alcohol, or use illegal drugs. Some items may interact with your medicine. °What should I watch for while using this medication? °Visit your doctor for checks  on your progress. Report any side effects. Continue your course of treatment even though you feel ill unless your doctor tells you to stop. °Call your doctor or health care professional for advice if you get a fever, chills or sore throat, or other symptoms of a cold or flu. Do not treat yourself. Try to avoid being around people who are sick. °You may experience fever, chills and shaking during your first infusion. These effects are usually mild and can be treated with other medicines. Report any side effects during the infusion to your health care professional. Fever and chills usually do not happen with later infusions. °Do not become pregnant while taking this medicine or for 7 months after stopping it. Women should inform their doctor if they wish to become pregnant or think they might be pregnant. Women of child-bearing potential will need to have a negative pregnancy test before starting this medicine. There is a potential for serious side effects to an unborn child. Talk to your health care professional or pharmacist for more information. Do not breast-feed an infant while taking this medicine or for 7 months after stopping it. °Women must use effective birth control with this medicine. °What side effects may I notice from receiving this medication? °Side effects that you should report to your doctor or health care professional as soon as possible: °allergic reactions like skin rash, itching or hives, swelling of the face, lips, or tongue °chest pain or palpitations °cough °dizziness °feeling faint or lightheaded, falls °fever °general ill feeling or flu-like symptoms °signs of worsening heart failure like breathing problems; swelling in your legs and feet °unusually weak or tired °Side effects that usually   do not require medical attention (report to your doctor or health care professional if they continue or are bothersome): °bone pain °changes in taste °diarrhea °joint pain °nausea/vomiting °weight  loss °This list may not describe all possible side effects. Call your doctor for medical advice about side effects. You may report side effects to FDA at 1-800-FDA-1088. °Where should I keep my medication? °This drug is given in a hospital or clinic and will not be stored at home. °NOTE: This sheet is a summary. It may not cover all possible information. If you have questions about this medicine, talk to your doctor, pharmacist, or health care provider. °© 2022 Elsevier/Gold Standard (2016-02-13 00:00:00) ° °

## 2021-04-25 MED FILL — Trastuzumab-anns For IV Soln 150 MG: INTRAVENOUS | Qty: 22 | Status: AC

## 2021-04-26 ENCOUNTER — Other Ambulatory Visit: Payer: Self-pay

## 2021-04-26 ENCOUNTER — Inpatient Hospital Stay: Payer: Medicare Other | Attending: Oncology

## 2021-04-26 VITALS — BP 156/74 | HR 93 | Temp 98.1°F | Resp 20 | Ht 64.0 in | Wt 177.0 lb

## 2021-04-26 DIAGNOSIS — C50411 Malignant neoplasm of upper-outer quadrant of right female breast: Secondary | ICD-10-CM | POA: Diagnosis present

## 2021-04-26 DIAGNOSIS — Z5112 Encounter for antineoplastic immunotherapy: Secondary | ICD-10-CM | POA: Insufficient documentation

## 2021-04-26 MED ORDER — SODIUM CHLORIDE 0.9% FLUSH
10.0000 mL | INTRAVENOUS | Status: DC | PRN
  Administered 2021-04-26: 10 mL

## 2021-04-26 MED ORDER — SODIUM CHLORIDE 0.9 % IV SOLN: Freq: Once | INTRAVENOUS | Status: AC

## 2021-04-26 MED ORDER — HEPARIN SOD (PORK) LOCK FLUSH 100 UNIT/ML IV SOLN
500.0000 [IU] | Freq: Once | INTRAVENOUS | Status: AC | PRN
  Administered 2021-04-26: 500 [IU]

## 2021-04-26 MED ORDER — ACETAMINOPHEN 325 MG PO TABS: 650.0000 mg | ORAL_TABLET | Freq: Once | ORAL | Status: DC

## 2021-04-26 MED ORDER — DIPHENHYDRAMINE HCL 25 MG PO CAPS: 25.0000 mg | ORAL_CAPSULE | Freq: Once | ORAL | Status: DC

## 2021-04-26 MED ORDER — TRASTUZUMAB-ANNS CHEMO 150 MG IV SOLR
6.0000 mg/kg | Freq: Once | INTRAVENOUS | Status: AC
  Administered 2021-04-26: 462 mg via INTRAVENOUS
  Filled 2021-04-26: qty 22

## 2021-04-26 NOTE — Patient Instructions (Signed)
Trastuzumab injection for infusion °What is this medication? °TRASTUZUMAB (tras TOO zoo mab) is a monoclonal antibody. It is used to treat breast cancer and stomach cancer. °This medicine may be used for other purposes; ask your health care provider or pharmacist if you have questions. °COMMON BRAND NAME(S): Herceptin, Herzuma, KANJINTI, Ogivri, Ontruzant, Trazimera °What should I tell my care team before I take this medication? °They need to know if you have any of these conditions: °heart disease °heart failure °lung or breathing disease, like asthma °an unusual or allergic reaction to trastuzumab, benzyl alcohol, or other medications, foods, dyes, or preservatives °pregnant or trying to get pregnant °breast-feeding °How should I use this medication? °This drug is given as an infusion into a vein. It is administered in a hospital or clinic by a specially trained health care professional. °Talk to your pediatrician regarding the use of this medicine in children. This medicine is not approved for use in children. °Overdosage: If you think you have taken too much of this medicine contact a poison control center or emergency room at once. °NOTE: This medicine is only for you. Do not share this medicine with others. °What if I miss a dose? °It is important not to miss a dose. Call your doctor or health care professional if you are unable to keep an appointment. °What may interact with this medication? °This medicine may interact with the following medications: °certain types of chemotherapy, such as daunorubicin, doxorubicin, epirubicin, and idarubicin °This list may not describe all possible interactions. Give your health care provider a list of all the medicines, herbs, non-prescription drugs, or dietary supplements you use. Also tell them if you smoke, drink alcohol, or use illegal drugs. Some items may interact with your medicine. °What should I watch for while using this medication? °Visit your doctor for checks  on your progress. Report any side effects. Continue your course of treatment even though you feel ill unless your doctor tells you to stop. °Call your doctor or health care professional for advice if you get a fever, chills or sore throat, or other symptoms of a cold or flu. Do not treat yourself. Try to avoid being around people who are sick. °You may experience fever, chills and shaking during your first infusion. These effects are usually mild and can be treated with other medicines. Report any side effects during the infusion to your health care professional. Fever and chills usually do not happen with later infusions. °Do not become pregnant while taking this medicine or for 7 months after stopping it. Women should inform their doctor if they wish to become pregnant or think they might be pregnant. Women of child-bearing potential will need to have a negative pregnancy test before starting this medicine. There is a potential for serious side effects to an unborn child. Talk to your health care professional or pharmacist for more information. Do not breast-feed an infant while taking this medicine or for 7 months after stopping it. °Women must use effective birth control with this medicine. °What side effects may I notice from receiving this medication? °Side effects that you should report to your doctor or health care professional as soon as possible: °allergic reactions like skin rash, itching or hives, swelling of the face, lips, or tongue °chest pain or palpitations °cough °dizziness °feeling faint or lightheaded, falls °fever °general ill feeling or flu-like symptoms °signs of worsening heart failure like breathing problems; swelling in your legs and feet °unusually weak or tired °Side effects that usually   do not require medical attention (report to your doctor or health care professional if they continue or are bothersome): °bone pain °changes in taste °diarrhea °joint pain °nausea/vomiting °weight  loss °This list may not describe all possible side effects. Call your doctor for medical advice about side effects. You may report side effects to FDA at 1-800-FDA-1088. °Where should I keep my medication? °This drug is given in a hospital or clinic and will not be stored at home. °NOTE: This sheet is a summary. It may not cover all possible information. If you have questions about this medicine, talk to your doctor, pharmacist, or health care provider. °© 2022 Elsevier/Gold Standard (2016-02-13 00:00:00) ° °

## 2021-04-27 ENCOUNTER — Ambulatory Visit (INDEPENDENT_AMBULATORY_CARE_PROVIDER_SITE_OTHER): Payer: Medicare Other | Admitting: Internal Medicine

## 2021-04-27 ENCOUNTER — Encounter: Payer: Self-pay | Admitting: Internal Medicine

## 2021-04-27 ENCOUNTER — Other Ambulatory Visit: Payer: Self-pay

## 2021-04-27 VITALS — BP 134/80 | HR 70 | Wt 175.8 lb

## 2021-04-27 DIAGNOSIS — E1165 Type 2 diabetes mellitus with hyperglycemia: Secondary | ICD-10-CM | POA: Diagnosis not present

## 2021-04-27 LAB — POCT GLUCOSE (DEVICE FOR HOME USE): Glucose Fasting, POC: 181 mg/dL — AB (ref 70–99)

## 2021-04-27 LAB — POCT GLYCOSYLATED HEMOGLOBIN (HGB A1C): Hemoglobin A1C: 7.6 % — AB (ref 4.0–5.6)

## 2021-04-27 MED ORDER — INSULIN PEN NEEDLE 32G X 4 MM MISC: 1.0000 | Freq: Four times a day (QID) | 3 refills | Status: DC

## 2021-04-27 MED ORDER — TRESIBA FLEXTOUCH 200 UNIT/ML ~~LOC~~ SOPN: 45.0000 [IU] | PEN_INJECTOR | Freq: Every day | SUBCUTANEOUS | 6 refills | Status: DC

## 2021-04-27 MED ORDER — NOVOLOG FLEXPEN 100 UNIT/ML ~~LOC~~ SOPN: PEN_INJECTOR | SUBCUTANEOUS | 3 refills | Status: DC

## 2021-04-27 NOTE — Progress Notes (Signed)
?Name: Heather Waller  ?Age/ Sex: 71 y.o., female   ?MRN/ DOB: [id number], 06-May-1950    ? ?PCP: Clancy Gourd, NP   ?Reason for Endocrinology Evaluation: Type 2 Diabetes Mellitus  ?Initial Endocrine Consultative Visit: 02/23/2020  ? ? ?PATIENT IDENTIFIER: Heather Waller is a 71 y.o. female with a past medical history of T2DM, HTN , Hx breast CA (status postlumpectomy 07/2020 )and dyslipidemia. The patient has followed with Endocrinology clinic since 02/23/2020 for consultative assistance with management of her diabetes. ? ? ? ? ?DIABETIC HISTORY:  ?Heather Waller was diagnosed with DM yrs ago,Intolerant to Metformin due to diarrhea .  Her hemoglobin A1c has ranged from 10.7% in 2021, peaking at 10.8%  in 2022. ? ? ?On her initial visit to our clinic she had an A1c of 10.8%, she was on [place] which we continued and added prandial insulin. ?SUBJECTIVE:  ? ?During the last visit (12/27/2020): A1c 6.1 % we increased Ozempic, and decreased tresiba and continued prandial insulin  ? ? ? ?Today (04/27/2021): Heather Waller is here for a follow up on diabetes management.  She checks her blood sugars 3  times daily. The patient has not had hypoglycemic episodes since the last clinic visit. ? ? ?She is status post right lumpectomy secondary to breast cancer 07/2020 ?She is on dexamethasone 4 mg 2 tabs for 2 days before chemo  Q 3 weeks . She completed chemo cycles,so she is not on dexamethasone anymore . She is on Trastuzumab infusions , every 3 weeks, until 07/2021 ? ?Denies nausea, vomiting or diarrhea but has constipation  ?She is having insomnia  ? ? ?HOME DIABETES REGIMEN:  ?Ozempic 1 mg weekly ?Tresiba 45 units daily ?NovoLog 12 units  ? ? ? ? ?Statin: Declines (02/2020) ?ACE-I/ARB: Yes ? ? ? ?Unable to Download:  ?115 - 181 mg/dL  ? ? ? ?DIABETIC COMPLICATIONS: ?Microvascular complications:  ?Neuropathy- chemo related  ?Denies: CKD, retinopathy ?Last Eye Exam: Completed 2022 ? ?Macrovascular  complications:  ? ?Denies: CAD, CVA, PVD ? ? ?HISTORY:  ?Past Medical History:  ?Past Medical History:  ?Diagnosis Date  ? Diabetes mellitus without complication ([place])   ? Hyperlipidemia   ? Hypertension   ? ?Past Surgical History: No past surgical history on file. ?Social History:  reports that she has never smoked. She has never used smokeless tobacco. She reports that she does not currently use alcohol. No history on file for drug use. ?Family History:  ?Family History  ?Problem Relation Age of Onset  ? Alzheimer's disease Mother   ? Hypertension Father   ? ? ? ?HOME MEDICATIONS: ?Allergies as of 04/27/2021   ? ?   Reactions  ? Codeine   ? Iodine   ? Oxycodone   ? ?  ? ?  ?Medication List  ?  ? ?  ? Accurate as of April 27, 2021 10:25 AM. If you have any questions, ask your nurse or doctor.  ?  ?  ? ?  ? ?anastrozole 1 MG tablet ?Commonly known as: ARIMIDEX ?Take 1 tablet (1 mg total) by mouth daily. ?  ?FreeStyle Libre 2 Sensor Misc ?1 Device by Does not apply route as directed. ?  ?glimepiride 4 MG tablet ?Commonly known as: AMARYL ?SMARTSIG:1 By Mouth ?  ?Insulin Pen Needle 32G X 4 MM Misc ?1 Device by Does not apply route in the morning, at noon, in the evening, and at bedtime. ?  ?lidocaine-prilocaine cream ?Commonly known as: EMLA ?Apply 1 application  topically as needed. ?  ?losartan 50 MG tablet ?Commonly known as: COZAAR ?SMARTSIG:1 By Mouth ?  ?NovoLOG FlexPen 100 UNIT/ML FlexPen ?Generic drug: insulin aspart ?Max daily 60 units ?  ?Ozempic (0.25 or 0.5 MG/DOSE) 2 MG/1.5ML Sopn ?Generic drug: Semaglutide(0.25 or 0.'5MG'$ /DOS) ?Inject 0.5 mg into the skin once a week. ?  ?Ozempic (1 MG/DOSE) 4 MG/3ML Sopn ?Generic drug: Semaglutide (1 MG/DOSE) ?Inject into the skin. ?  ?Strata xrt Gel ?Apply topically 2 (two) times daily. to affected area(s) ?  ?Tyler Aas FlexTouch 200 UNIT/ML FlexTouch Pen ?Generic drug: insulin degludec ?Inject 50 units into skin daily ?  ?tretinoin 0.025 % cream ?Commonly known as:  RETIN-A ?Apply topically. ?  ?triamcinolone cream 0.5 % ?Commonly known as: KENALOG ?Apply topically. ?  ?valsartan-hydrochlorothiazide 80-12.5 MG tablet ?Commonly known as: DIOVAN-HCT ?Take 1 tablet by mouth daily. ?  ? ?  ? ? ? ?OBJECTIVE:  ? ?Vital Signs: BP 134/80 (BP Location: Left Arm, Patient Position: Sitting, Cuff Size: Small)   Pulse 70   Wt 175 lb 12.8 oz (79.7 kg)   SpO2 96%   BMI 30.18 kg/m?   ?Wt Readings from Last 3 Encounters:  ?04/27/21 175 lb 12.8 oz (79.7 kg)  ?04/26/21 177 lb (80.3 kg)  ?04/05/21 176 lb (79.8 kg)  ? ? ? ?Exam: ?General: Pt appears well and is in NAD  ?Lungs: Clear with good BS bilat   ?Heart: RRR   ?Abdomen: Normoactive bowel sounds, soft, nontender, without masses or organomegaly palpable  ?Extremities: Trace  pretibial edema.   ?Neuro: MS is good with appropriate affect, pt is alert and Ox3  ? ? ?DM foot exam: 04/27/2021 ?  ?  ?The skin of the feet is intact without sores or ulcerations. ?The pedal pulses are 2+ on right and 2+ on left. ?The sensation is intact to a screening 5.07, 10 gram monofilament bilaterally ?  ? ?DATA REVIEWED: ? ?Lab Results  ?Component Value Date  ? HGBA1C 7.6 (A) 04/27/2021  ? HGBA1C 6.1 (A) 12/27/2020  ? HGBA1C 9.0 (A) 09/27/2020  ? ? Latest Reference Range & Units 03/13/21 00:00  ?Sodium 137 - 147  135 ! (E)  ?Potassium 3.4 - 5.3  3.9 (E)  ?Chloride 99 - 108  99 (E)  ?CO2 13 - 22  28 ! (E)  ?Glucose  161 (E)  ?BUN 4 - 21  13 (E)  ?Creatinine 0.5 - 1.1  0.4 ! (E)  ?Calcium 8.7 - 10.7  9.5 (E)  ?Magnesium  1.8 (E)  ?Alkaline Phosphatase 25 - 125  72 (E)  ?Albumin 3.5 - 5.0  4.2 (E)  ?AST 13 - 35  21 (E)  ?ALT 7 - 35  20 (E)  ?Bilirubin, Total  0.3 (E)  ? ? Latest Reference Range & Units 04/27/21 10:08  ?Glucose Fasting, POC 70 - 99 mg/dL 181 !  ? ? ? ? ?ASSESSMENT / PLAN / RECOMMENDATIONS:  ? ?1) Type 2 Diabetes Mellitus, Sub-Optimally controlled, With out complications - Most recent A1c of 7.6%. Goal A1c < 7.0 %.   ? ? ?- A1c increased from 6.1%  to 7.5 %  ?- Ozempic has been costing $500 , but a recent pharmacy proce check was $15 I have asked her to discontinue , she was asked to discontinue if cost continues to rise  ?- She was trained on using freestyle Narcissa but unfortunately this had difficulty maneuvering it  ?- Will increase prandial insulin as below  ? ?MEDICATIONS: ?Continue  Ozempic 1  mg weekly  ?Continue  Tresiba 45 units daily ?Increase  NovoLog 14 units with each meal ? ? ?EDUCATION / INSTRUCTIONS: ?BG monitoring instructions: Patient is instructed to check her blood sugars 3 times a day, before meals . ?Call [facility] if: BG persistently < 70  ?I reviewed the Rule of 15 for the treatment of hypoglycemia in detail with the patient. Literature supplied. ? ? ?2) Diabetic complications:  ?Eye: Does not have known diabetic retinopathy.  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  ?Renal: Patient does not have known baseline CKD. She   is  on an ACEI/ARB at present.  ? ? ?F/U in 6 months  ? ? ?Signed electronically by: ?Abby Nena Jordan, MD ? ?[facility]  ?[facility] ?[address] ?[place], [place] [address] ?Phone: [phone] ?FAX: [phone] ? ? ?CC: ?Clancy Gourd, NP ?[address] ?[place] [address] ?Phone: [phone]  ?Fax: [phone] ? ?Return to Endocrinology clinic as below: ?Future Appointments  ?Date Time Provider [place]  ?05/17/2021  1:30 PM [facility] BED 1 [facility] None  ?06/07/2021  1:30 PM [facility] INFUSION CHAIR 7 [facility] None  ?07/11/2021  9:15 AM [facility]-LAB [facility] None  ?07/11/2021  9:45 AM [name], MD [facility] None  ?  ? ? ?

## 2021-04-27 NOTE — Patient Instructions (Addendum)
Continue Ozempic 1 mg once weekly  ?Continue  Tresiba  45  units daily  ?Increase  Novolog 14 units with each meal  ? ? ? ? ? ?HOW TO TREAT LOW BLOOD SUGARS (Blood sugar LESS THAN 70 MG/DL) ?Please follow the RULE OF 15 for the treatment of hypoglycemia treatment (when your (blood sugars are less than 70 mg/dL)  ? ?STEP 1: Take 15 grams of carbohydrates when your blood sugar is low, which includes:  ?3-4 GLUCOSE TABS  OR ?3-4 OZ OF JUICE OR REGULAR SODA OR ?ONE TUBE OF GLUCOSE GEL   ? ?STEP 2: RECHECK blood sugar in 15 MINUTES ?STEP 3: If your blood sugar is still low at the 15 minute recheck --> then, go back to STEP 1 and treat AGAIN with another 15 grams of carbohydrate ?

## 2021-05-08 ENCOUNTER — Other Ambulatory Visit: Payer: Self-pay | Admitting: Surgery

## 2021-05-08 DIAGNOSIS — Z853 Personal history of malignant neoplasm of breast: Secondary | ICD-10-CM

## 2021-05-10 ENCOUNTER — Telehealth (HOSPITAL_COMMUNITY): Payer: Self-pay | Admitting: Pharmacy Technician

## 2021-05-10 ENCOUNTER — Other Ambulatory Visit: Payer: Self-pay | Admitting: Internal Medicine

## 2021-05-10 ENCOUNTER — Other Ambulatory Visit (HOSPITAL_COMMUNITY): Payer: Self-pay

## 2021-05-10 ENCOUNTER — Encounter: Payer: Self-pay | Admitting: Oncology

## 2021-05-10 MED ORDER — INSULIN LISPRO (1 UNIT DIAL) 100 UNIT/ML (KWIKPEN)
PEN_INJECTOR | SUBCUTANEOUS | 3 refills | Status: DC
Start: 2021-05-10 — End: 2021-11-02

## 2021-05-10 NOTE — Telephone Encounter (Signed)
Patient's Insurance prefers Humalog over Novolog. ? ? ? ? ?Lyndel Safe, CPhT ?Pharmacy Patient Advocate Specialist ?[facility] Patient Advocate Team ?Direct Number: [phone]  Fax: [phone]  ?

## 2021-05-17 ENCOUNTER — Inpatient Hospital Stay: Payer: Medicare Other | Attending: Oncology

## 2021-05-17 VITALS — BP 160/72 | HR 90 | Temp 98.3°F | Resp 18 | Ht 64.0 in | Wt 178.2 lb

## 2021-05-17 DIAGNOSIS — C50411 Malignant neoplasm of upper-outer quadrant of right female breast: Secondary | ICD-10-CM | POA: Diagnosis present

## 2021-05-17 DIAGNOSIS — Z5112 Encounter for antineoplastic immunotherapy: Secondary | ICD-10-CM | POA: Insufficient documentation

## 2021-05-17 DIAGNOSIS — Z170 Estrogen receptor positive status [ER+]: Secondary | ICD-10-CM | POA: Insufficient documentation

## 2021-05-17 MED ORDER — TRASTUZUMAB-ANNS CHEMO 150 MG IV SOLR
6.0000 mg/kg | Freq: Once | INTRAVENOUS | Status: AC
  Administered 2021-05-17: 462 mg via INTRAVENOUS
  Filled 2021-05-17: qty 22

## 2021-05-17 MED ORDER — HEPARIN SOD (PORK) LOCK FLUSH 100 UNIT/ML IV SOLN
500.0000 [IU] | Freq: Once | INTRAVENOUS | Status: AC | PRN
  Administered 2021-05-17: 500 [IU]

## 2021-05-17 MED ORDER — DIPHENHYDRAMINE HCL 25 MG PO CAPS: 25.0000 mg | ORAL_CAPSULE | Freq: Once | ORAL | Status: DC

## 2021-05-17 MED ORDER — ACETAMINOPHEN 325 MG PO TABS: 650.0000 mg | ORAL_TABLET | Freq: Once | ORAL | Status: DC

## 2021-05-17 MED ORDER — SODIUM CHLORIDE 0.9 % IV SOLN: Freq: Once | INTRAVENOUS | Status: AC

## 2021-05-17 MED ORDER — SODIUM CHLORIDE 0.9% FLUSH
10.0000 mL | INTRAVENOUS | Status: DC | PRN
  Administered 2021-05-17: 10 mL

## 2021-05-17 NOTE — Patient Instructions (Signed)
[facility]  Discharge Instructions: Thank you for choosing [facility] to provide your oncology and hematology care.  If you have a lab appointment with the Cancer Center, please go directly to the Cancer Center and check in at the registration area.   Wear comfortable clothing and clothing appropriate for easy access to any Portacath or PICC line.   We strive to give you quality time with your provider. You may need to reschedule your appointment if you arrive late (15 or more minutes).  Arriving late affects you and other patients whose appointments are after yours.  Also, if you miss three or more appointments without notifying the office, you may be dismissed from the clinic at the provider's discretion.      For prescription refill requests, have your pharmacy contact our office and allow 72 hours for refills to be completed.    Today you received the following chemotherapy and/or immunotherapy agents Trastuzumab     To help prevent nausea and vomiting after your treatment, we encourage you to take your nausea medication as directed.  BELOW ARE SYMPTOMS THAT SHOULD BE REPORTED IMMEDIATELY: *FEVER GREATER THAN 100.4 F (38 C) OR HIGHER *CHILLS OR SWEATING *NAUSEA AND VOMITING THAT IS NOT CONTROLLED WITH YOUR NAUSEA MEDICATION *UNUSUAL SHORTNESS OF BREATH *UNUSUAL BRUISING OR BLEEDING *URINARY PROBLEMS (pain or burning when urinating, or frequent urination) *BOWEL PROBLEMS (unusual diarrhea, constipation, pain near the anus) TENDERNESS IN MOUTH AND THROAT WITH OR WITHOUT PRESENCE OF ULCERS (sore throat, sores in mouth, or a toothache) UNUSUAL RASH, SWELLING OR PAIN  UNUSUAL VAGINAL DISCHARGE OR ITCHING   Items with * indicate a potential emergency and should be followed up as soon as possible or go to the Emergency Department if any problems should occur.  Please show the CHEMOTHERAPY ALERT CARD or IMMUNOTHERAPY ALERT CARD at check-in to the  Emergency Department and triage nurse.  Should you have questions after your visit or need to cancel or reschedule your appointment, please contact [facility]  Dept: [phone]  and follow the prompts.  Office hours are 8:00 a.m. to 4:30 p.m. Monday - Friday. Please note that voicemails left after 4:00 p.m. may not be returned until the following business day.  We are closed weekends and major holidays. You have access to a nurse at all times for urgent questions. Please call the main number to the clinic Dept: [phone] and follow the prompts.  For any non-urgent questions, you may also contact your provider using MyChart. We now offer e-Visits for anyone 18 and older to request care online for non-urgent symptoms. For details visit [email or web address].   Also download the MyChart app! Go to the app store, search "MyChart", open the app, select [facility], and log in with your MyChart username and password.  Due to Covid, a mask is required upon entering the hospital/clinic. If you do not have a mask, one will be given to you upon arrival. For doctor visits, patients may have 1 support person aged 18 or older with them. For treatment visits, patients cannot have anyone with them due to current Covid guidelines and our immunocompromised population.    

## 2021-05-17 NOTE — Progress Notes (Signed)
1453: ?

## 2021-06-06 ENCOUNTER — Encounter: Payer: Self-pay | Admitting: Surgery

## 2021-06-07 ENCOUNTER — Inpatient Hospital Stay: Payer: Medicare Other

## 2021-06-07 VITALS — BP 168/67 | HR 91 | Temp 97.7°F | Resp 18 | Ht 64.0 in | Wt 176.0 lb

## 2021-06-07 DIAGNOSIS — Z79899 Other long term (current) drug therapy: Secondary | ICD-10-CM

## 2021-06-07 DIAGNOSIS — Z170 Estrogen receptor positive status [ER+]: Secondary | ICD-10-CM

## 2021-06-07 DIAGNOSIS — Z5112 Encounter for antineoplastic immunotherapy: Secondary | ICD-10-CM | POA: Diagnosis not present

## 2021-06-07 MED ORDER — SODIUM CHLORIDE 0.9 % IV SOLN: Freq: Once | INTRAVENOUS | Status: AC

## 2021-06-07 MED ORDER — HEPARIN SOD (PORK) LOCK FLUSH 100 UNIT/ML IV SOLN
500.0000 [IU] | Freq: Once | INTRAVENOUS | Status: AC | PRN
  Administered 2021-06-07: 500 [IU]

## 2021-06-07 MED ORDER — ACETAMINOPHEN 325 MG PO TABS: 650.0000 mg | ORAL_TABLET | Freq: Once | ORAL | Status: DC

## 2021-06-07 MED ORDER — TRASTUZUMAB-ANNS CHEMO 150 MG IV SOLR
6.0000 mg/kg | Freq: Once | INTRAVENOUS | Status: AC
  Administered 2021-06-07: 462 mg via INTRAVENOUS
  Filled 2021-06-07: qty 22

## 2021-06-07 MED ORDER — DIPHENHYDRAMINE HCL 25 MG PO CAPS: 25.0000 mg | ORAL_CAPSULE | Freq: Once | ORAL | Status: DC

## 2021-06-07 MED ORDER — SODIUM CHLORIDE 0.9% FLUSH
10.0000 mL | INTRAVENOUS | Status: DC | PRN
  Administered 2021-06-07: 10 mL

## 2021-06-07 NOTE — Patient Instructions (Signed)
Trastuzumab injection for infusion ?What is this medication? ?TRASTUZUMAB (tras TOO zoo mab) is a monoclonal antibody. It is used to treat breast cancer and stomach cancer. ?This medicine may be used for other purposes; ask your health care provider or pharmacist if you have questions. ?COMMON BRAND NAME(S): Herceptin, Herzuma, KANJINTI, Ogivri, Ontruzant, Trazimera ?What should I tell my care team before I take this medication? ?They need to know if you have any of these conditions: ?heart disease ?heart failure ?lung or breathing disease, like asthma ?an unusual or allergic reaction to trastuzumab, benzyl alcohol, or other medications, foods, dyes, or preservatives ?pregnant or trying to get pregnant ?breast-feeding ?How should I use this medication? ?This drug is given as an infusion into a vein. It is administered in a hospital or clinic by a specially trained health care professional. ?Talk to your pediatrician regarding the use of this medicine in children. This medicine is not approved for use in children. ?Overdosage: If you think you have taken too much of this medicine contact a poison control center or emergency room at once. ?NOTE: This medicine is only for you. Do not share this medicine with others. ?What if I miss a dose? ?It is important not to miss a dose. Call your doctor or health care professional if you are unable to keep an appointment. ?What may interact with this medication? ?This medicine may interact with the following medications: ?certain types of chemotherapy, such as daunorubicin, doxorubicin, epirubicin, and idarubicin ?This list may not describe all possible interactions. Give your health care provider a list of all the medicines, herbs, non-prescription drugs, or dietary supplements you use. Also tell them if you smoke, drink alcohol, or use illegal drugs. Some items may interact with your medicine. ?What should I watch for while using this medication? ?Visit your doctor for checks  on your progress. Report any side effects. Continue your course of treatment even though you feel ill unless your doctor tells you to stop. ?Call your doctor or health care professional for advice if you get a fever, chills or sore throat, or other symptoms of a cold or flu. Do not treat yourself. Try to avoid being around people who are sick. ?You may experience fever, chills and shaking during your first infusion. These effects are usually mild and can be treated with other medicines. Report any side effects during the infusion to your health care professional. Fever and chills usually do not happen with later infusions. ?Do not become pregnant while taking this medicine or for 7 months after stopping it. Women should inform their doctor if they wish to become pregnant or think they might be pregnant. Women of child-bearing potential will need to have a negative pregnancy test before starting this medicine. There is a potential for serious side effects to an unborn child. Talk to your health care professional or pharmacist for more information. Do not breast-feed an infant while taking this medicine or for 7 months after stopping it. ?Women must use effective birth control with this medicine. ?What side effects may I notice from receiving this medication? ?Side effects that you should report to your doctor or health care professional as soon as possible: ?allergic reactions like skin rash, itching or hives, swelling of the face, lips, or tongue ?chest pain or palpitations ?cough ?dizziness ?feeling faint or lightheaded, falls ?fever ?general ill feeling or flu-like symptoms ?signs of worsening heart failure like breathing problems; swelling in your legs and feet ?unusually weak or tired ?Side effects that usually   do not require medical attention (report to your doctor or health care professional if they continue or are bothersome): ?bone pain ?changes in taste ?diarrhea ?joint pain ?nausea/vomiting ?weight  loss ?This list may not describe all possible side effects. Call your doctor for medical advice about side effects. You may report side effects to FDA at 1-800-FDA-1088. ?Where should I keep my medication? ?This drug is given in a hospital or clinic and will not be stored at home. ?NOTE: This sheet is a summary. It may not cover all possible information. If you have questions about this medicine, talk to your doctor, pharmacist, or health care provider. ?? 2023 Elsevier/Gold Standard (2016-02-13 00:00:00) ? ?

## 2021-06-12 ENCOUNTER — Ambulatory Visit
Admission: RE | Admit: 2021-06-12 | Discharge: 2021-06-12 | Disposition: A | Discharge status: Home / Self Care | Payer: Medicare Other | Source: Ambulatory Visit | Attending: Surgery | Admitting: Surgery

## 2021-06-12 DIAGNOSIS — Z853 Personal history of malignant neoplasm of breast: Secondary | ICD-10-CM

## 2021-06-14 ENCOUNTER — Ambulatory Visit (INDEPENDENT_AMBULATORY_CARE_PROVIDER_SITE_OTHER): Payer: Medicare Other

## 2021-06-14 DIAGNOSIS — C50411 Malignant neoplasm of upper-outer quadrant of right female breast: Secondary | ICD-10-CM | POA: Diagnosis not present

## 2021-06-14 DIAGNOSIS — Z0189 Encounter for other specified special examinations: Secondary | ICD-10-CM

## 2021-06-14 DIAGNOSIS — Z170 Estrogen receptor positive status [ER+]: Secondary | ICD-10-CM | POA: Diagnosis not present

## 2021-06-14 DIAGNOSIS — Z79899 Other long term (current) drug therapy: Secondary | ICD-10-CM

## 2021-06-14 LAB — ECHOCARDIOGRAM COMPLETE
Area-P 1/2: 6.27 cm2
S' Lateral: 2.3 cm

## 2021-06-18 ENCOUNTER — Other Ambulatory Visit: Payer: Self-pay | Admitting: Surgery

## 2021-06-18 DIAGNOSIS — Z9889 Other specified postprocedural states: Secondary | ICD-10-CM

## 2021-06-28 ENCOUNTER — Inpatient Hospital Stay: Payer: Medicare Other | Attending: Oncology

## 2021-06-28 VITALS — BP 147/68 | HR 86 | Temp 97.9°F | Resp 18 | Ht 64.0 in | Wt 174.0 lb

## 2021-06-28 DIAGNOSIS — Z5112 Encounter for antineoplastic immunotherapy: Secondary | ICD-10-CM | POA: Insufficient documentation

## 2021-06-28 DIAGNOSIS — C50411 Malignant neoplasm of upper-outer quadrant of right female breast: Secondary | ICD-10-CM | POA: Diagnosis present

## 2021-06-28 DIAGNOSIS — Z170 Estrogen receptor positive status [ER+]: Secondary | ICD-10-CM

## 2021-06-28 MED ORDER — HEPARIN SOD (PORK) LOCK FLUSH 100 UNIT/ML IV SOLN
500.0000 [IU] | Freq: Once | INTRAVENOUS | Status: AC | PRN
  Administered 2021-06-28: 500 [IU]

## 2021-06-28 MED ORDER — SODIUM CHLORIDE 0.9% FLUSH
10.0000 mL | INTRAVENOUS | Status: DC | PRN
  Administered 2021-06-28: 10 mL

## 2021-06-28 MED ORDER — TRASTUZUMAB-ANNS CHEMO 150 MG IV SOLR
6.0000 mg/kg | Freq: Once | INTRAVENOUS | Status: AC
  Administered 2021-06-28: 462 mg via INTRAVENOUS
  Filled 2021-06-28: qty 22

## 2021-06-28 MED ORDER — SODIUM CHLORIDE 0.9 % IV SOLN: Freq: Once | INTRAVENOUS | Status: AC

## 2021-06-28 MED ORDER — ACETAMINOPHEN 325 MG PO TABS: 650.0000 mg | ORAL_TABLET | Freq: Once | ORAL | Status: DC

## 2021-06-28 MED ORDER — DIPHENHYDRAMINE HCL 25 MG PO CAPS: 25.0000 mg | ORAL_CAPSULE | Freq: Once | ORAL | Status: DC

## 2021-06-28 NOTE — Patient Instructions (Signed)
Trastuzumab injection for infusion ?What is this medication? ?TRASTUZUMAB (tras TOO zoo mab) is a monoclonal antibody. It is used to treat breast cancer and stomach cancer. ?This medicine may be used for other purposes; ask your health care provider or pharmacist if you have questions. ?COMMON BRAND NAME(S): Herceptin, Herzuma, KANJINTI, Ogivri, Ontruzant, Trazimera ?What should I tell my care team before I take this medication? ?They need to know if you have any of these conditions: ?heart disease ?heart failure ?lung or breathing disease, like asthma ?an unusual or allergic reaction to trastuzumab, benzyl alcohol, or other medications, foods, dyes, or preservatives ?pregnant or trying to get pregnant ?breast-feeding ?How should I use this medication? ?This drug is given as an infusion into a vein. It is administered in a hospital or clinic by a specially trained health care professional. ?Talk to your pediatrician regarding the use of this medicine in children. This medicine is not approved for use in children. ?Overdosage: If you think you have taken too much of this medicine contact a poison control center or emergency room at once. ?NOTE: This medicine is only for you. Do not share this medicine with others. ?What if I miss a dose? ?It is important not to miss a dose. Call your doctor or health care professional if you are unable to keep an appointment. ?What may interact with this medication? ?This medicine may interact with the following medications: ?certain types of chemotherapy, such as daunorubicin, doxorubicin, epirubicin, and idarubicin ?This list may not describe all possible interactions. Give your health care provider a list of all the medicines, herbs, non-prescription drugs, or dietary supplements you use. Also tell them if you smoke, drink alcohol, or use illegal drugs. Some items may interact with your medicine. ?What should I watch for while using this medication? ?Visit your doctor for checks  on your progress. Report any side effects. Continue your course of treatment even though you feel ill unless your doctor tells you to stop. ?Call your doctor or health care professional for advice if you get a fever, chills or sore throat, or other symptoms of a cold or flu. Do not treat yourself. Try to avoid being around people who are sick. ?You may experience fever, chills and shaking during your first infusion. These effects are usually mild and can be treated with other medicines. Report any side effects during the infusion to your health care professional. Fever and chills usually do not happen with later infusions. ?Do not become pregnant while taking this medicine or for 7 months after stopping it. Women should inform their doctor if they wish to become pregnant or think they might be pregnant. Women of child-bearing potential will need to have a negative pregnancy test before starting this medicine. There is a potential for serious side effects to an unborn child. Talk to your health care professional or pharmacist for more information. Do not breast-feed an infant while taking this medicine or for 7 months after stopping it. ?Women must use effective birth control with this medicine. ?What side effects may I notice from receiving this medication? ?Side effects that you should report to your doctor or health care professional as soon as possible: ?allergic reactions like skin rash, itching or hives, swelling of the face, lips, or tongue ?chest pain or palpitations ?cough ?dizziness ?feeling faint or lightheaded, falls ?fever ?general ill feeling or flu-like symptoms ?signs of worsening heart failure like breathing problems; swelling in your legs and feet ?unusually weak or tired ?Side effects that usually   do not require medical attention (report to your doctor or health care professional if they continue or are bothersome): ?bone pain ?changes in taste ?diarrhea ?joint pain ?nausea/vomiting ?weight  loss ?This list may not describe all possible side effects. Call your doctor for medical advice about side effects. You may report side effects to FDA at 1-800-FDA-1088. ?Where should I keep my medication? ?This drug is given in a hospital or clinic and will not be stored at home. ?NOTE: This sheet is a summary. It may not cover all possible information. If you have questions about this medicine, talk to your doctor, pharmacist, or health care provider. ?? 2023 Elsevier/Gold Standard (2016-02-13 00:00:00) ? ?

## 2021-07-10 NOTE — Progress Notes (Signed)
[facility]  [address] [place],  [place]  [address] [phone]  Clinic Day:  07/11/2021  Referring physician: Clancy Gourd, NP  HISTORY OF PRESENT ILLNESS:  The patient is a 71 y.o. female with stage IA (T1c N0 M0) hormone/Her2 positive right breast cancer, status post a lumpectomy in June 2022.  She completed 6 cycles of adjuvant TCH chemotherapy in October 2022. She continues to take maintenance Herceptin to complete her 1 full year of anti-Her2 therapy.  Of note, she is scheduled for 17th and final cycle of maintenance Herceptin next week.  She comes in today for routine follow-up.  Since her last visit, the patient's been doing very well.  She denies having any particular changes in her breasts which concern her for early disease recurrence.  Of note, her annual mammogram done earlier this month showed no evidence of disease recurrence.  VITALS:  Blood pressure (!) 176/79, pulse 92, temperature 98.7 F (37.1 C), resp. rate 16, height 5' 4.5" (1.638 m), weight 177 lb 14.4 oz (80.7 kg), SpO2 95 %.  Wt Readings from Last 3 Encounters:  07/11/21 177 lb 14.4 oz (80.7 kg)  06/28/21 174 lb (78.9 kg)  06/07/21 176 lb (79.8 kg)    Body mass index is 30.06 kg/m.  Performance status (ECOG): 1  PHYSICAL EXAM:  Physical Exam Constitutional:      General: She is not in acute distress.    Appearance: Normal appearance. She is normal weight.  HENT:     Head: Normocephalic and atraumatic.     Mouth/Throat:     Pharynx: Oropharynx is clear. No oropharyngeal exudate.  Eyes:     General: No scleral icterus.    Extraocular Movements: Extraocular movements intact.     Conjunctiva/sclera: Conjunctivae normal.     Pupils: Pupils are equal, round, and reactive to light.  Cardiovascular:     Rate and Rhythm: Normal rate and regular rhythm.     Pulses: Normal pulses.     Heart sounds: Normal heart sounds. No murmur heard.   No friction rub. No gallop.   Pulmonary:     Effort: Pulmonary effort is normal. No respiratory distress.     Breath sounds: Normal breath sounds.  Chest:  Breasts:    Right: Skin change (radiation-induced skin changes over right breast; 2-3 cm area of superficial skin breakdown under her right breast; no purulence appreciated) present. No swelling, bleeding, inverted nipple, mass or nipple discharge.     Left: No swelling, bleeding, inverted nipple, mass, nipple discharge or skin change.  Abdominal:     General: Bowel sounds are normal. There is no distension.     Palpations: Abdomen is soft. There is no hepatomegaly, splenomegaly or mass.     Tenderness: There is no abdominal tenderness.  Musculoskeletal:        General: No tenderness. Normal range of motion.     Cervical back: Normal range of motion and neck supple.     Right lower leg: No edema.     Left lower leg: No edema.  Lymphadenopathy:     Cervical: No cervical adenopathy.     Right cervical: No superficial, deep or posterior cervical adenopathy.    Left cervical: No superficial, deep or posterior cervical adenopathy.     Upper Body:     Right upper body: No supraclavicular or axillary adenopathy.     Left upper body: No supraclavicular or axillary adenopathy.     Lower Body: No  right inguinal adenopathy. No left inguinal adenopathy.  Skin:    General: Skin is warm and dry.     Coloration: Skin is not jaundiced.     Findings: No lesion or rash.  Neurological:     General: No focal deficit present.     Mental Status: She is alert and oriented to person, place, and time. Mental status is at baseline.  Psychiatric:        Mood and Affect: Mood normal.        Behavior: Behavior normal.        Thought Content: Thought content normal.        Judgment: Judgment normal.   ASSESSMENT & PLAN:  Assessment/Plan:  A  71 y.o. female with stage IA (T1c N0 M0) hormone/Her2 positive breast cancer.  Based upon her clinical breast exam today and her recent  mammogram, the patient remains disease-free.  She will continue with her maintenance Herceptin to complete her 1 full year of anti-Her2 therapy, with her last treatment being next week.  She also knows to continue taking her anastrozole for her adjuvant endocrine therapy.  Overall, she appears to be doing fine.  I will see her back in 4 months for a repeat clinical breast exam.  The patient understands all the plans discussed today and is in agreement with them.    Selena Swaminathan Macarthur Critchley, MD

## 2021-07-11 ENCOUNTER — Other Ambulatory Visit: Payer: Self-pay | Admitting: Oncology

## 2021-07-11 ENCOUNTER — Inpatient Hospital Stay: Payer: Medicare Other

## 2021-07-11 ENCOUNTER — Telehealth: Payer: Self-pay | Admitting: Oncology

## 2021-07-11 ENCOUNTER — Inpatient Hospital Stay (INDEPENDENT_AMBULATORY_CARE_PROVIDER_SITE_OTHER): Payer: Medicare Other | Admitting: Oncology

## 2021-07-11 VITALS — BP 176/79 | HR 92 | Temp 98.7°F | Resp 16 | Ht 64.5 in | Wt 177.9 lb

## 2021-07-11 DIAGNOSIS — Z170 Estrogen receptor positive status [ER+]: Secondary | ICD-10-CM | POA: Diagnosis not present

## 2021-07-11 DIAGNOSIS — C50411 Malignant neoplasm of upper-outer quadrant of right female breast: Secondary | ICD-10-CM | POA: Diagnosis not present

## 2021-07-11 LAB — CBC AND DIFFERENTIAL
HCT: 39 (ref 36–46)
Hemoglobin: 13 (ref 12.0–16.0)
Neutrophils Absolute: 5.66
Platelets: 255 10*3/uL (ref 150–400)
WBC: 8.7

## 2021-07-11 LAB — CBC: RBC: 4.28 (ref 3.87–5.11)

## 2021-07-11 NOTE — Telephone Encounter (Signed)
07/11/21 los next appt scheduled and confirmed with patient

## 2021-07-13 ENCOUNTER — Other Ambulatory Visit: Payer: Self-pay | Admitting: Internal Medicine

## 2021-07-18 MED FILL — Trastuzumab-anns For IV Soln 150 MG: INTRAVENOUS | Qty: 22 | Status: AC

## 2021-07-19 ENCOUNTER — Inpatient Hospital Stay: Payer: Medicare Other | Attending: Oncology

## 2021-07-19 VITALS — BP 153/72 | HR 87 | Temp 98.2°F | Resp 18 | Wt 177.1 lb

## 2021-07-19 DIAGNOSIS — Z5112 Encounter for antineoplastic immunotherapy: Secondary | ICD-10-CM | POA: Insufficient documentation

## 2021-07-19 DIAGNOSIS — Z170 Estrogen receptor positive status [ER+]: Secondary | ICD-10-CM | POA: Diagnosis not present

## 2021-07-19 DIAGNOSIS — C50411 Malignant neoplasm of upper-outer quadrant of right female breast: Secondary | ICD-10-CM | POA: Diagnosis present

## 2021-07-19 MED ORDER — TRASTUZUMAB-ANNS CHEMO 150 MG IV SOLR
6.0000 mg/kg | Freq: Once | INTRAVENOUS | Status: AC
  Administered 2021-07-19: 462 mg via INTRAVENOUS
  Filled 2021-07-19: qty 22

## 2021-07-19 MED ORDER — ACETAMINOPHEN 325 MG PO TABS: 650.0000 mg | ORAL_TABLET | Freq: Once | ORAL | Status: DC

## 2021-07-19 MED ORDER — DIPHENHYDRAMINE HCL 25 MG PO CAPS: 25.0000 mg | ORAL_CAPSULE | Freq: Once | ORAL | Status: DC

## 2021-07-19 MED ORDER — SODIUM CHLORIDE 0.9% FLUSH: 10.0000 mL | INTRAVENOUS | Status: DC | PRN

## 2021-07-19 MED ORDER — HEPARIN SOD (PORK) LOCK FLUSH 100 UNIT/ML IV SOLN
500.0000 [IU] | Freq: Once | INTRAVENOUS | Status: AC | PRN
  Administered 2021-07-19: 500 [IU]

## 2021-07-19 MED ORDER — SODIUM CHLORIDE 0.9 % IV SOLN: Freq: Once | INTRAVENOUS | Status: AC

## 2021-07-19 NOTE — Patient Instructions (Addendum)
[facility]  Discharge Instructions: Thank you for choosing [facility] to provide your oncology and hematology care.  If you have a lab appointment with the [facility], please go directly to the [facility] and check in at the registration area.   Wear comfortable clothing and clothing appropriate for easy access to any Portacath or PICC line.   We strive to give you quality time with your provider. You may need to reschedule your appointment if you arrive late (15 or more minutes).  Arriving late affects you and other patients whose appointments are after yours.  Also, if you miss three or more appointments without notifying the office, you may be dismissed from the clinic at the provider's discretion.      For prescription refill requests, have your pharmacy contact our office and allow 72 hours for refills to be completed.    Today you received the following chemotherapy and/or immunotherapy agents:Trastuzumab Trastuzumab injection for infusion What is this medication? TRASTUZUMAB (tras TOO zoo mab) is a monoclonal antibody. It is used to treat breast cancer and stomach cancer. This medicine may be used for other purposes; ask your health care provider or pharmacist if you have questions. COMMON BRAND NAME(S): Herceptin, Janae Bridgeman, Ontruzant, Trazimera What should I tell my care team before I take this medication? They need to know if you have any of these conditions: heart disease heart failure lung or breathing disease, like asthma an unusual or allergic reaction to trastuzumab, benzyl alcohol, or other medications, foods, dyes, or preservatives pregnant or trying to get pregnant breast-feeding How should I use this medication? This drug is given as an infusion into a vein. It is administered in a hospital or clinic by a specially trained health care professional. Talk to your pediatrician regarding the use of this medicine  in children. This medicine is not approved for use in children. Overdosage: If you think you have taken too much of this medicine contact a poison control center or emergency room at once. NOTE: This medicine is only for you. Do not share this medicine with others. What if I miss a dose? It is important not to miss a dose. Call your doctor or health care professional if you are unable to keep an appointment. What may interact with this medication? This medicine may interact with the following medications: certain types of chemotherapy, such as daunorubicin, doxorubicin, epirubicin, and idarubicin This list may not describe all possible interactions. Give your health care provider a list of all the medicines, herbs, non-prescription drugs, or dietary supplements you use. Also tell them if you smoke, drink alcohol, or use illegal drugs. Some items may interact with your medicine. What should I watch for while using this medication? Visit your doctor for checks on your progress. Report any side effects. Continue your course of treatment even though you feel ill unless your doctor tells you to stop. Call your doctor or health care professional for advice if you get a fever, chills or sore throat, or other symptoms of a cold or flu. Do not treat yourself. Try to avoid being around people who are sick. You may experience fever, chills and shaking during your first infusion. These effects are usually mild and can be treated with other medicines. Report any side effects during the infusion to your health care professional. Fever and chills usually do not happen with later infusions. Do not become pregnant while taking this medicine or for 7 months  after stopping it. Women should inform their doctor if they wish to become pregnant or think they might be pregnant. Women of child-bearing potential will need to have a negative pregnancy test before starting this medicine. There is a potential for serious side  effects to an unborn child. Talk to your health care professional or pharmacist for more information. Do not breast-feed an infant while taking this medicine or for 7 months after stopping it. Women must use effective birth control with this medicine. What side effects may I notice from receiving this medication? Side effects that you should report to your doctor or health care professional as soon as possible: allergic reactions like skin rash, itching or hives, swelling of the face, lips, or tongue chest pain or palpitations cough dizziness feeling faint or lightheaded, falls fever general ill feeling or flu-like symptoms signs of worsening heart failure like breathing problems; swelling in your legs and feet unusually weak or tired Side effects that usually do not require medical attention (report to your doctor or health care professional if they continue or are bothersome): bone pain changes in taste diarrhea joint pain nausea/vomiting weight loss This list may not describe all possible side effects. Call your doctor for medical advice about side effects. You may report side effects to FDA at 1-800-FDA-1088. Where should I keep my medication? This drug is given in a hospital or clinic and will not be stored at home. NOTE: This sheet is a summary. It may not cover all possible information. If you have questions about this medicine, talk to your doctor, pharmacist, or health care provider.  2023 Elsevier/Gold Standard (2016-02-13 00:00:00)        To help prevent nausea and vomiting after your treatment, we encourage you to take your nausea medication as directed.  BELOW ARE SYMPTOMS THAT SHOULD BE REPORTED IMMEDIATELY: *FEVER GREATER THAN 100.4 F (38 C) OR HIGHER *CHILLS OR SWEATING *NAUSEA AND VOMITING THAT IS NOT CONTROLLED WITH YOUR NAUSEA MEDICATION *UNUSUAL SHORTNESS OF BREATH *UNUSUAL BRUISING OR BLEEDING *URINARY PROBLEMS (pain or burning when urinating, or frequent  urination) *BOWEL PROBLEMS (unusual diarrhea, constipation, pain near the anus) TENDERNESS IN MOUTH AND THROAT WITH OR WITHOUT PRESENCE OF ULCERS (sore throat, sores in mouth, or a toothache) UNUSUAL RASH, SWELLING OR PAIN  UNUSUAL VAGINAL DISCHARGE OR ITCHING   Items with * indicate a potential emergency and should be followed up as soon as possible or go to the Emergency Department if any problems should occur.  Please show the CHEMOTHERAPY ALERT CARD or IMMUNOTHERAPY ALERT CARD at check-in to the Emergency Department and triage nurse.  Should you have questions after your visit or need to cancel or reschedule your appointment, please contact [facility]  Dept: [phone]  and follow the prompts.  Office hours are 8:00 a.m. to 4:30 p.m. Monday - Friday. Please note that voicemails left after 4:00 p.m. may not be returned until the following business day.  We are closed weekends and major holidays. You have access to a nurse at all times for urgent questions. Please call the main number to the clinic Dept: [phone] and follow the prompts.  For any non-urgent questions, you may also contact your provider using MyChart. We now offer e-Visits for anyone 5 and older to request care online for non-urgent symptoms. For details visit [email or web address].   Also download the MyChart app! Go to the app store, search "MyChart", open the app, select [facility], and log in with your MyChart  username and password.  Due to Covid, a mask is required upon entering the hospital/clinic. If you do not have a mask, one will be given to you upon arrival. For doctor visits, patients may have 1 support person aged 82 or older with them. For treatment visits, patients cannot have anyone with them due to current Covid guidelines and our immunocompromised population.

## 2021-09-14 ENCOUNTER — Other Ambulatory Visit: Payer: Self-pay | Admitting: Oncology

## 2021-10-12 ENCOUNTER — Other Ambulatory Visit: Payer: Self-pay | Admitting: Internal Medicine

## 2021-11-02 ENCOUNTER — Ambulatory Visit (INDEPENDENT_AMBULATORY_CARE_PROVIDER_SITE_OTHER): Payer: Medicare Other | Admitting: Internal Medicine

## 2021-11-02 ENCOUNTER — Encounter: Payer: Self-pay | Admitting: Internal Medicine

## 2021-11-02 VITALS — BP 126/80 | HR 73 | Ht 64.5 in | Wt 177.0 lb

## 2021-11-02 DIAGNOSIS — E1165 Type 2 diabetes mellitus with hyperglycemia: Secondary | ICD-10-CM | POA: Diagnosis not present

## 2021-11-02 LAB — POCT GLYCOSYLATED HEMOGLOBIN (HGB A1C): Hemoglobin A1C: 7.5 % — AB (ref 4.0–5.6)

## 2021-11-02 MED ORDER — OZEMPIC (1 MG/DOSE) 4 MG/3ML ~~LOC~~ SOPN: 1.0000 mg | PEN_INJECTOR | SUBCUTANEOUS | 11 refills | Status: DC

## 2021-11-02 MED ORDER — NOVOLOG FLEXPEN 100 UNIT/ML ~~LOC~~ SOPN: 14.0000 [IU] | PEN_INJECTOR | Freq: Three times a day (TID) | SUBCUTANEOUS | 3 refills | Status: DC

## 2021-11-02 MED ORDER — INSULIN PEN NEEDLE 32G X 4 MM MISC: 1.0000 | Freq: Four times a day (QID) | 3 refills | Status: DC

## 2021-11-02 MED ORDER — TRESIBA FLEXTOUCH 200 UNIT/ML ~~LOC~~ SOPN: 50.0000 [IU] | PEN_INJECTOR | Freq: Every day | SUBCUTANEOUS | 4 refills | Status: DC

## 2021-11-02 NOTE — Progress Notes (Signed)
Name: Mirelle Biskup  Age/ Sex: 71 y.o., female   MRN/ DOB: [id number], 23-Oct-1950     PCP: Clancy Gourd, NP   Reason for Endocrinology Evaluation: Type 2 Diabetes Mellitus  Initial Endocrine Consultative Visit: 02/23/2020    PATIENT IDENTIFIER: Ms. Sharlet Notaro is a 71 y.o. female with a past medical history of T2DM, HTN , Hx breast CA (status postlumpectomy 07/2020 )and dyslipidemia. The patient has followed with Endocrinology clinic since 02/23/2020 for consultative assistance with management of her diabetes.     DIABETIC HISTORY:  Ms. Henandez was diagnosed with DM yrs ago,Intolerant to Metformin due to diarrhea .  Her hemoglobin A1c has ranged from 10.7% in 2021, peaking at 10.8%  in 2022.    On her initial visit to our clinic she had an A1c of 10.8%, she was on [place] which we continued and added prandial insulin.   SUBJECTIVE:   During the last visit (04/27/2021): A1c 7.6 %      Today (11/02/2021): Ms. Shilling is here for a follow up on diabetes management.  She checks her blood sugars 3  times daily. The patient has not had hypoglycemic episodes since the last clinic visit.   She continues to follow-up with oncology for history of breast cancer, she remains disease-free, she continues to be on trastuzumab infusions   Has occasional diarrhea and constipation that she attributes to [place]:  Ozempic 1 mg weekly Tresiba 45 units daily NovoLog 14 units      Statin: Declines (02/2020) ACE-I/ARB: Yes    Unable to Download: dates on correct   158  - 229 mg/dL     DIABETIC COMPLICATIONS: Microvascular complications:  Neuropathy- chemo related  Denies: CKD, retinopathy Last Eye Exam: Completed 2022  Macrovascular complications:   Denies: CAD, CVA, PVD   HISTORY:  Past Medical History:  Past Medical History:  Diagnosis Date   Diabetes mellitus without complication ([place])    Hyperlipidemia     Hypertension    Past Surgical History: No past surgical history on file. Social History:  reports that she has never smoked. She has never used smokeless tobacco. She reports that she does not currently use alcohol. No history on file for drug use. Family History:  Family History  Problem Relation Age of Onset   Alzheimer's disease Mother    Hypertension Father      HOME MEDICATIONS: Allergies as of 11/02/2021       Reactions   Codeine    Iodine    Oxycodone         Medication List        Accurate as of November 02, 2021 10:20 AM. If you have any questions, ask your nurse or doctor.          STOP taking these medications    insulin lispro 100 UNIT/ML KwikPen Commonly known as: HumaLOG KwikPen Stopped by: Dorita Sciara, MD   losartan 50 MG tablet Commonly known as: COZAAR Stopped by: Dorita Sciara, MD       TAKE these medications    anastrozole 1 MG tablet Commonly known as: ARIMIDEX TAKE 1 TABLET BY MOUTH EVERY DAY   FreeStyle Libre 2 Sensor Misc 1 Device by Does not apply route as directed.   Insulin Pen Needle 32G X 4 MM Misc 1 Device by Does not apply route in the morning, at noon, in the evening, and at bedtime.   lidocaine-prilocaine cream Commonly known as:  EMLA Apply 1 application topically as needed.   NOVOLOG FLEXPEN [place] Inject 14 Units into the skin with breakfast, with lunch, and with evening meal.   Ozempic (1 MG/DOSE) 4 MG/3ML Sopn Generic drug: Semaglutide (1 MG/DOSE) INJECT '1MG'$  INTO THE SKIN ONCE A WEEK   Strata xrt Gel Apply topically 2 (two) times daily. to affected area(s)   Tyler Aas FlexTouch 200 UNIT/ML FlexTouch Pen Generic drug: insulin degludec INJECT 50 UNITS INTO SKIN DAILY What changed: See the new instructions.   tretinoin 0.025 % cream Commonly known as: RETIN-A Apply topically.   triamcinolone cream 0.5 % Commonly known as: KENALOG Apply topically.   valsartan-hydrochlorothiazide 80-12.5 MG  tablet Commonly known as: DIOVAN-HCT Take 1 tablet by mouth daily.         OBJECTIVE:   Vital Signs: BP 126/80 (BP Location: Left Arm, Patient Position: Sitting, Cuff Size: Large)   Pulse 73   Ht 5' 4.5" (1.638 m)   Wt 177 lb (80.3 kg)   SpO2 93%   BMI 29.91 kg/m   Wt Readings from Last 3 Encounters:  11/02/21 177 lb (80.3 kg)  07/19/21 177 lb 1.9 oz (80.3 kg)  07/11/21 177 lb 14.4 oz (80.7 kg)     Exam: General: Pt appears well and is in NAD  Lungs: Clear with good BS bilat   Heart: RRR   Abdomen: Normoactive bowel sounds, soft, nontender, without masses or organomegaly palpable  Extremities: No   pretibial edema.   Neuro: MS is good with appropriate affect, pt is alert and Ox3    DM foot exam: 04/27/2021     The skin of the feet is intact without sores or ulcerations. The pedal pulses are 2+ on right and 2+ on left. The sensation is intact to a screening 5.07, 10 gram monofilament bilaterally    DATA REVIEWED:  Lab Results  Component Value Date   HGBA1C 7.5 (A) 11/02/2021   HGBA1C 7.6 (A) 04/27/2021   HGBA1C 6.1 (A) 12/27/2020    Latest Reference Range & Units 03/13/21 00:00  Sodium 137 - 147  135 ! (E)  Potassium 3.4 - 5.3  3.9 (E)  Chloride 99 - 108  99 (E)  CO2 13 - 22  28 ! (E)  Glucose  161 (E)  BUN 4 - 21  13 (E)  Creatinine 0.5 - 1.1  0.4 ! (E)  Calcium 8.7 - 10.7  9.5 (E)  Magnesium  1.8 (E)  Alkaline Phosphatase 25 - 125  72 (E)  Albumin 3.5 - 5.0  4.2 (E)  AST 13 - 35  21 (E)  ALT 7 - 35  20 (E)  Bilirubin, Total  0.3 (E)    Latest Reference Range & Units 04/27/21 10:08  Glucose Fasting, POC 70 - 99 mg/dL 181 !      ASSESSMENT / PLAN / RECOMMENDATIONS:   1) Type 2 Diabetes Mellitus, Sub-Optimally controlled, With out complications - Most recent A1c of 7.5%. Goal A1c < 7.0 %.     - A1c remains stable but above goal  - She has issues with learning and maneuvering CGM in the past and opted with finger sticks  - She attributes  occasional change in bowel movements to Ozempic, we discussed the idea of stopping it and starting SGLT-2 I, discussed risk of genital infections. Pt states GI symptoms are not severe and just occasional, she also declines to increase the dose at this time  - Will increase basal insulin as below   MEDICATIONS:  Continue  Ozempic 1 mg weekly  Increase  Tresiba 48 units daily Continue  NovoLog 14 units with each meal   EDUCATION / INSTRUCTIONS: BG monitoring instructions: Patient is instructed to check her blood sugars 3 times a day, before meals . Call [facility] if: BG persistently < 70  I reviewed the Rule of 15 for the treatment of hypoglycemia in detail with the patient. Literature supplied.   2) Diabetic complications:  Eye: Does not have known diabetic retinopathy.  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  Renal: Patient does not have known baseline CKD. She   is  on an ACEI/ARB at present.    F/U in 6 months   I spent 25 minutes preparing to see the patient by review of recent labs, obtaining and reviewing separately obtained history, communicating with the patient, answeing her questions and addressing concerns , ordering medications, and documenting clinical information in the EHR including the differential Dx, treatment, and any further evaluation and other management   Signed electronically by: Mack Guise, MD  [facility]  [address] [place], [place] [address] Phone: [phone] FAX: [phone]   CC: Clancy Gourd, NP [address] [place] [address] Phone: [phone]  Fax: [phone]  Return to Endocrinology clinic as below: Future Appointments  Date Time Provider [place]  11/14/2021  9:30 AM [facility] None  11/14/2021 10:00 AM [name], MD [facility] None  06/14/2022 11:00 AM [facility] CONSULT [facility]

## 2021-11-02 NOTE — Patient Instructions (Addendum)
Continue Ozempic 1 mg once weekly  Increase  Tresiba  48  units daily  Continue Novolog 14 units with each meal       HOW TO TREAT LOW BLOOD SUGARS (Blood sugar LESS THAN 70 MG/DL) Please follow the RULE OF 15 for the treatment of hypoglycemia treatment (when your (blood sugars are less than 70 mg/dL)   STEP 1: Take 15 grams of carbohydrates when your blood sugar is low, which includes:  3-4 GLUCOSE TABS  OR 3-4 OZ OF JUICE OR REGULAR SODA OR ONE TUBE OF GLUCOSE GEL    STEP 2: RECHECK blood sugar in 15 MINUTES STEP 3: If your blood sugar is still low at the 15 minute recheck --> then, go back to STEP 1 and treat AGAIN with another 15 grams of carbohydrate

## 2021-11-14 ENCOUNTER — Other Ambulatory Visit: Payer: Self-pay | Admitting: Oncology

## 2021-11-14 ENCOUNTER — Inpatient Hospital Stay: Payer: Medicare Other

## 2021-11-14 ENCOUNTER — Telehealth: Payer: Self-pay | Admitting: Oncology

## 2021-11-14 ENCOUNTER — Other Ambulatory Visit: Payer: Self-pay | Admitting: Internal Medicine

## 2021-11-14 ENCOUNTER — Inpatient Hospital Stay: Payer: Medicare Other | Attending: Oncology | Admitting: Oncology

## 2021-11-14 VITALS — BP 161/78 | HR 88 | Temp 99.3°F | Resp 14 | Ht 64.5 in | Wt 177.8 lb

## 2021-11-14 DIAGNOSIS — C50411 Malignant neoplasm of upper-outer quadrant of right female breast: Secondary | ICD-10-CM | POA: Diagnosis not present

## 2021-11-14 DIAGNOSIS — Z170 Estrogen receptor positive status [ER+]: Secondary | ICD-10-CM | POA: Diagnosis not present

## 2021-11-14 MED ORDER — PREGABALIN 100 MG PO CAPS: 100.0000 mg | ORAL_CAPSULE | Freq: Two times a day (BID) | ORAL | 5 refills | Status: DC

## 2021-11-14 MED ORDER — ANASTROZOLE 1 MG PO TABS: 1.0000 mg | ORAL_TABLET | Freq: Every day | ORAL | 3 refills | Status: DC

## 2021-11-14 NOTE — Telephone Encounter (Signed)
11/14/21 Next appt scheduled and confirmed with patient 

## 2021-11-14 NOTE — Progress Notes (Signed)
[facility]  [address] [place],  [place]  [address] [phone]  Clinic Day:  11/14/2021  Referring physician: Clancy Gourd, NP  HISTORY OF PRESENT ILLNESS:  The patient is a 71 y.o. female with stage IA (T1c N0 M0) hormone/Her2 positive right breast cancer, status post a lumpectomy in June 2022.  She completed 6 cycles of adjuvant TCH chemotherapy in October 2022. She completed her maintenance Herceptin in June 2023.  She comes in today for routine follow-up.  Since her last visit, the patient has been doing very well.  She denies having any particular changes in her breasts which concern her for disease recurrence.  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  VITALS:  Blood pressure (!) 161/78, pulse 88, temperature 99.3 F (37.4 C), resp. rate 14, height 5' 4.5" (1.638 m), weight 177 lb 12.8 oz (80.6 kg), SpO2 95 %.  Wt Readings from Last 3 Encounters:  11/14/21 177 lb 12.8 oz (80.6 kg)  11/02/21 177 lb (80.3 kg)  07/19/21 177 lb 1.9 oz (80.3 kg)    Body mass index is 30.05 kg/m.  Performance status (ECOG): 1  PHYSICAL EXAM:  Physical Exam Constitutional:      General: She is not in acute distress.    Appearance: Normal appearance. She is normal weight.  HENT:     Head: Normocephalic and atraumatic.     Mouth/Throat:     Pharynx: Oropharynx is clear. No oropharyngeal exudate.  Eyes:     General: No scleral icterus.    Extraocular Movements: Extraocular movements intact.     Conjunctiva/sclera: Conjunctivae normal.     Pupils: Pupils are equal, round, and reactive to light.  Cardiovascular:     Rate and Rhythm: Normal rate and regular rhythm.     Pulses: Normal pulses.     Heart sounds: Normal heart sounds. No murmur heard.    No friction rub. No gallop.  Pulmonary:     Effort: Pulmonary effort is normal. No respiratory distress.     Breath sounds:  Normal breath sounds.  Chest:  Breasts:    Right: No swelling, bleeding, inverted nipple, mass, nipple discharge or skin change (radiation-induced skin changes over right breast; 2-3 cm area of superficial skin breakdown under her right breast; no purulence appreciated).     Left: No swelling, bleeding, inverted nipple, mass, nipple discharge or skin change.  Abdominal:     General: Bowel sounds are normal. There is no distension.     Palpations: Abdomen is soft. There is no hepatomegaly, splenomegaly or mass.     Tenderness: There is no abdominal tenderness.  Musculoskeletal:        General: No tenderness. Normal range of motion.     Cervical back: Normal range of motion and neck supple.     Right lower leg: No edema.     Left lower leg: No edema.  Lymphadenopathy:     Cervical: No cervical adenopathy.     Right cervical: No superficial, deep or posterior cervical adenopathy.    Left cervical: No superficial, deep or posterior cervical adenopathy.     Upper Body:     Right upper body: No supraclavicular or axillary adenopathy.     Left upper body: No supraclavicular or axillary adenopathy.     Lower Body: No right inguinal adenopathy. No left inguinal adenopathy.  Skin:    General: Skin is warm and dry.  Coloration: Skin is not jaundiced.     Findings: No lesion or rash.  Neurological:     General: No focal deficit present.     Mental Status: She is alert and oriented to person, place, and time. Mental status is at baseline.  Psychiatric:        Mood and Affect: Mood normal.        Behavior: Behavior normal.        Thought Content: Thought content normal.        Judgment: Judgment normal.    ASSESSMENT & PLAN:  Assessment/Plan:  A  71 y.o. female with stage IA (T1c N0 M0) hormone/Her2 positive breast cancer.  Based upon her clinical breast exam today, the patient remains disease-free.  She knows to continue taking her anastrozole daily x 5 years for her adjuvant endocrine  therapy.  Overall, she appears to be doing well.  I will see her back in 4 months for a repeat clinical breast exam.  The patient understands all the plans discussed today and is in agreement with them.    Timica Marcom Macarthur Critchley, MD

## 2022-03-17 NOTE — Progress Notes (Signed)
[facility]  [address] [place],  [place]  [address] [phone]  Clinic Day:  03/18/2022  Referring physician: Clancy Gourd, NP  HISTORY OF PRESENT ILLNESS:  The patient is a 72 y.o. female with stage IA (T1c N0 M0) hormone/Her2 positive right breast cancer, status post a lumpectomy in June 2022.  She completed 6 cycles of adjuvant TCH chemotherapy in October 2022. She completed her maintenance Herceptin in June 2023.  She comes in today for routine follow-up.  Since her last visit, the patient has been doing very well.  She denies having any particular changes in her breasts which concern her for disease recurrence.  Her only lingering issue is peripheral neuropathy, which likely arose from the Taxotere component of her adjuvant chemotherapy.  However, she claims it is not as bad as it has been in the past.  VITALS:  Blood pressure (!) 157/95, pulse 81, temperature 98.7 F (37.1 C), resp. rate 16, height 5' 4.5" (1.638 m), weight 184 lb 6.4 oz (83.6 kg), SpO2 96 %.  Wt Readings from Last 3 Encounters:  03/18/22 184 lb 6.4 oz (83.6 kg)  11/14/21 177 lb 12.8 oz (80.6 kg)  11/02/21 177 lb (80.3 kg)    Body mass index is 31.16 kg/m.  Performance status (ECOG): 1  PHYSICAL EXAM:  Physical Exam Constitutional:      General: She is not in acute distress.    Appearance: Normal appearance. She is normal weight.  HENT:     Head: Normocephalic and atraumatic.     Mouth/Throat:     Pharynx: Oropharynx is clear. No oropharyngeal exudate.  Eyes:     General: No scleral icterus.    Extraocular Movements: Extraocular movements intact.     Conjunctiva/sclera: Conjunctivae normal.     Pupils: Pupils are equal, round, and reactive to light.  Cardiovascular:     Rate and Rhythm: Normal rate and regular rhythm.     Pulses: Normal pulses.     Heart sounds: Normal heart sounds. No murmur heard.    No friction rub. No gallop.  Pulmonary:     Effort:  Pulmonary effort is normal. No respiratory distress.     Breath sounds: Normal breath sounds.  Chest:  Breasts:    Right: No swelling, bleeding, inverted nipple, mass, nipple discharge or skin change.     Left: No swelling, bleeding, inverted nipple, mass, nipple discharge or skin change.  Abdominal:     General: Bowel sounds are normal. There is no distension.     Palpations: Abdomen is soft. There is no hepatomegaly, splenomegaly or mass.     Tenderness: There is no abdominal tenderness.  Musculoskeletal:        General: No tenderness. Normal range of motion.     Cervical back: Normal range of motion and neck supple.     Right lower leg: No edema.     Left lower leg: No edema.  Lymphadenopathy:     Cervical: No cervical adenopathy.     Right cervical: No superficial, deep or posterior cervical adenopathy.    Left cervical: No superficial, deep or posterior cervical adenopathy.     Upper Body:     Right upper body: No supraclavicular or axillary adenopathy.     Left upper body: No supraclavicular or axillary adenopathy.     Lower Body: No right inguinal adenopathy. No left inguinal adenopathy.  Skin:    General: Skin is warm and dry.  Coloration: Skin is not jaundiced.     Findings: No lesion or rash.  Neurological:     General: No focal deficit present.     Mental Status: She is alert and oriented to person, place, and time. Mental status is at baseline.  Psychiatric:        Mood and Affect: Mood normal.        Behavior: Behavior normal.        Thought Content: Thought content normal.        Judgment: Judgment normal.    ASSESSMENT & PLAN:  Assessment/Plan:  A  72 y.o. female with stage IA (T1c N0 M0) hormone/Her2 positive breast cancer.  Based upon her clinical breast exam today, the patient remains disease-free.  She knows to continue taking her anastrozole daily x 5 years for her adjuvant endocrine therapy.  Overall, she appears to be doing well.  I will see her back in  4 months for a repeat clinical breast exam. Her annual mammogram will be scheduled before next visit for continued radiographic breast cancer surveillance.  If her next mammogram and clinical breast exam both come back normal, I will begin spacing all of the patient's future appointments out to every 6 months.  The patient understands all the plans discussed today and is in agreement with them.    Teghan Philbin Macarthur Critchley, MD

## 2022-03-18 ENCOUNTER — Inpatient Hospital Stay: Payer: PPO | Attending: Oncology | Admitting: Oncology

## 2022-03-18 ENCOUNTER — Other Ambulatory Visit: Payer: Self-pay | Admitting: Oncology

## 2022-03-18 VITALS — BP 157/95 | HR 81 | Temp 98.7°F | Resp 16 | Ht 64.5 in | Wt 184.4 lb

## 2022-03-18 DIAGNOSIS — Z170 Estrogen receptor positive status [ER+]: Secondary | ICD-10-CM | POA: Diagnosis not present

## 2022-03-18 DIAGNOSIS — C50411 Malignant neoplasm of upper-outer quadrant of right female breast: Secondary | ICD-10-CM

## 2022-03-21 ENCOUNTER — Ambulatory Visit (INDEPENDENT_AMBULATORY_CARE_PROVIDER_SITE_OTHER): Payer: PPO | Admitting: Nurse Practitioner

## 2022-03-21 ENCOUNTER — Encounter: Payer: Self-pay | Admitting: Nurse Practitioner

## 2022-03-21 VITALS — BP 136/72 | HR 80 | Temp 97.1°F | Resp 16 | Ht 62.5 in | Wt 183.0 lb

## 2022-03-21 DIAGNOSIS — E1159 Type 2 diabetes mellitus with other circulatory complications: Secondary | ICD-10-CM | POA: Diagnosis not present

## 2022-03-21 DIAGNOSIS — E1165 Type 2 diabetes mellitus with hyperglycemia: Secondary | ICD-10-CM

## 2022-03-21 DIAGNOSIS — E1142 Type 2 diabetes mellitus with diabetic polyneuropathy: Secondary | ICD-10-CM | POA: Diagnosis not present

## 2022-03-21 DIAGNOSIS — Z794 Long term (current) use of insulin: Secondary | ICD-10-CM

## 2022-03-21 DIAGNOSIS — C50411 Malignant neoplasm of upper-outer quadrant of right female breast: Secondary | ICD-10-CM | POA: Diagnosis not present

## 2022-03-21 DIAGNOSIS — I152 Hypertension secondary to endocrine disorders: Secondary | ICD-10-CM

## 2022-03-21 DIAGNOSIS — Z170 Estrogen receptor positive status [ER+]: Secondary | ICD-10-CM

## 2022-03-21 MED ORDER — GABAPENTIN 100 MG PO CAPS: 100.0000 mg | ORAL_CAPSULE | Freq: Two times a day (BID) | ORAL | 3 refills | Status: DC

## 2022-03-21 NOTE — Assessment & Plan Note (Signed)
Well controlled   Taking diovan-hct 80-12.5 OD Watching her diet and try to do some exercise  DASH diet is discussed and provided  Nutrition: Stressed importance of moderation in sodium intake, saturated fat and cholesterol, caloric balance, sufficient intake of complex carbohydrates, fiber, calcium and iron.   Exercise: Stressed the importance of regular exercise.

## 2022-03-21 NOTE — Progress Notes (Signed)
Subjective:  Patient ID: Heather Waller, female    DOB: 1950-09-11  Age: 72 y.o. MRN: [id number]  CHIEF COMPLAINT:  establishing care as a new patient   HPI Heather Waller is here to establish a care, she has a history of type 2 Diabetes Mellitus for 20  years. Last HBA1C was 7.5 from 2023. Current treatment includes Novolog 14 units 3 times daily with meals and   50 units Degludec . She does not take Ozempic anymore, said it gave her GI side effects like nausea, back pain. She denies hypoglycemic episodes. She states blood glucose levels at home range from 145 to 255. She  is consuming a heart healthy diet and exercising regularly. She performs diabetic foot checks daily after showering.Patient follows to endocrinologist , Dr. Kelton Pillar . Her Eye check up was done during October 2023  Patient goes to Dr Bobby Rumpf  and Dr Orrin Brigham for her malignant neoplasm of outer quadrant of right female breast, next appointment with them is Feb 5th 2024, upto date with Mammogram and bone density. Patient finished the chemo (total 6 chemotherapy infusion) and radiotherapy for a month.  She is now on medicine Arimidex 1 mg daily.   She was last seen for hypertension 1 years ago.  BP at this visit is 136/72. Management includes Valsartan-hydrochlorothiazide 80-12.5 mg .  She reports excellent compliance with treatment. She is not having side effects.  She is following a Regular diet. She is not regularly exercising. She does not smoke. She does not alcohol  Use of agents associated with hypertension: none.    Pertinent labs: No results found for: "CHOL", "HDL", "Palo", "LDLDIRECT", "TRIG", "CHOLHDL" Lab Results  Component Value Date   NA 135 (A) 03/13/2021   K 3.9 03/13/2021   CREATININE 0.4 (A) 03/13/2021     The ASCVD Risk score (Arnett DK, et al., 2019) failed to calculate for the following reasons:   Cannot find a previous HDL lab   Cannot find a previous total cholesterol  lab    Lab Results  Component Value Date   HGBA1C 7.5 (A) 11/02/2021   HGBA1C 7.6 (A) 04/27/2021   HGBA1C 6.1 (A) 12/27/2020         06/07/2021    1:30 PM 07/11/2021   10:11 AM 07/19/2021    1:40 PM 11/14/2021   10:22 AM 03/21/2022   11:12 AM  Fall Risk  Falls in the past year?     0  Was there an injury with Fall?     0  Fall Risk Category Calculator     0  (RETIRED) Patient Fall Risk Level Low fall risk Low fall risk Low fall risk Low fall risk       Review of Systems  Constitutional:  Negative for chills, fatigue and fever.  HENT:  Negative for congestion, rhinorrhea and sore throat.   Respiratory:  Negative for cough and shortness of breath.   Cardiovascular:  Negative for chest pain.  Gastrointestinal:  Positive for constipation (problems while taking ozempic). Negative for abdominal pain, diarrhea, nausea and vomiting.  Genitourinary:  Negative for dysuria and urgency.  Musculoskeletal:  Positive for back pain. Negative for myalgias.  Neurological:  Positive for numbness (chemo neuropathy in hands and feet). Negative for dizziness, weakness, light-headedness and headaches.  Psychiatric/Behavioral:  Negative for dysphoric mood. The patient is not nervous/anxious.     Current Outpatient Medications on File Prior to Visit  Medication Sig Dispense Refill   anastrozole (ARIMIDEX) 1  MG tablet Take 1 tablet (1 mg total) by mouth daily. 90 tablet 3   insulin aspart (NOVOLOG FLEXPEN) 100 UNIT/ML FlexPen Inject 14 Units into the skin with breakfast, with lunch, and with evening meal. 45 mL 3   Insulin Degludec FlexTouch 200 UNIT/ML SOPN INJECT 50 UNITS INTO THE SKIN DAILY IN THE AFTERNOON. 45 mL 4   Insulin Pen Needle 32G X 4 MM MISC 1 Device by Does not apply route in the morning, at noon, in the evening, and at bedtime. 400 each 3   valsartan-hydrochlorothiazide (DIOVAN-HCT) 80-12.5 MG tablet Take 1 tablet by mouth daily.     Continuous Blood Gluc Sensor (FREESTYLE LIBRE 2 SENSOR)  MISC 1 Device by Does not apply route as directed. (Patient not taking: Reported on 11/02/2021) 2 each 11   Semaglutide, 1 MG/DOSE, (OZEMPIC, 1 MG/DOSE,) 4 MG/3ML SOPN 1 mg by Other route once a week. (Patient not taking: Reported on 03/21/2022) 3 mL 11   No current facility-administered medications on file prior to visit.   Past Medical History:  Diagnosis Date   Diabetes mellitus without complication ([facility])    Hyperlipidemia    Hypertension    Type 2 diabetes mellitus with hyperglycemia ([facility]) 02/23/2020   Type 2 diabetes mellitus without complication, with long-term current use of insulin ([facility]) 12/29/2020   History reviewed. No pertinent surgical history.  Family History  Problem Relation Age of Onset   Alzheimer's disease Mother    Hypertension Father    Social History   Socioeconomic History   Marital status: Married    Spouse name: Not on file   Number of children: Not on file   Years of education: Not on file   Highest education level: Not on file  Occupational History   Not on file  Tobacco Use   Smoking status: Never   Smokeless tobacco: Never  Substance and Sexual Activity   Alcohol use: Not Currently   Drug use: Not on file   Sexual activity: Not on file  Other Topics Concern   Not on file  Social History Narrative   Not on file   Social Determinants of Health   Financial Resource Strain: Not on file  Food Insecurity: Not on file  Transportation Needs: Not on file  Physical Activity: Not on file  Stress: Not on file  Social Connections: Not on file    Objective:  BP 136/72   Pulse 80   Temp (!) 97.1 F (36.2 C)   Resp 16   Ht 5' 2.5" (1.588 m)   Wt 183 lb (83 kg)   BMI 32.94 kg/m      03/21/2022   10:35 AM 03/18/2022    2:24 PM 11/14/2021   10:22 AM  BP/Weight  Systolic BP 694 503 888  Diastolic BP 72 95 78  Wt. (Lbs) 183 184.4 177.8  BMI 32.94 kg/m2 31.16 kg/m2 30.05 kg/m2    Physical Exam Vitals reviewed.  Constitutional:       Appearance: Normal appearance.  Neck:     Vascular: No carotid bruit.  Cardiovascular:     Rate and Rhythm: Normal rate and regular rhythm.     Heart sounds: Normal heart sounds.  Pulmonary:     Effort: Pulmonary effort is normal.     Breath sounds: Normal breath sounds.  Abdominal:     General: Bowel sounds are normal.     Palpations: Abdomen is soft.     Tenderness: There is no abdominal tenderness.  Hernia: No hernia is present.     Comments: Right breast mastectomy  Neurological:     Mental Status: She is alert and oriented to person, place, and time.  Psychiatric:        Mood and Affect: Mood normal.        Behavior: Behavior normal.     Diabetic Foot Exam - Simple   Simple Foot Form Diabetic Foot exam was performed with the following findings: Yes 03/21/2022 11:12 AM  Visual Inspection No deformities, no ulcerations, no other skin breakdown bilaterally: Yes Sensation Testing Intact to touch and monofilament testing bilaterally: Yes Pulse Check Posterior Tibialis and Dorsalis pulse intact bilaterally: Yes Comments      Lab Results  Component Value Date   WBC 8.7 07/11/2021   HGB 13.0 07/11/2021   HCT 39 07/11/2021   PLT 255 07/11/2021   ALT 20 03/13/2021   AST 21 03/13/2021   NA 135 (A) 03/13/2021   K 3.9 03/13/2021   CL 99 03/13/2021   CREATININE 0.4 (A) 03/13/2021   BUN 13 03/13/2021   CO2 28 (A) 03/13/2021   HGBA1C 7.5 (A) 11/02/2021    Assessment & Plan:    Hypertension associated with diabetes ([facility]) Assessment & Plan: Well controlled   Taking diovan-hct 80-12.5 OD Watching her diet and try to do some exercise  DASH diet is discussed and provided  Nutrition: Stressed importance of moderation in sodium intake, saturated fat and cholesterol, caloric balance, sufficient intake of complex carbohydrates, fiber, calcium and iron.   Exercise: Stressed the importance of regular exercise.      Diabetic polyneuropathy associated with type 2 diabetes  mellitus ([facility]) Assessment & Plan: DM2 not well controlled Fasting runs between 145 to 255 Following up with Endocrinologist Dr Kelton Pillar Taking long acting degludec 50 units and Novolog 14 units 3 times daily before food Tried Ozempic in past but discontinued due to side effects Patient was educated other options of medicines and me as a provider starting her diabetic medicines but she wants to talk first with endocrinologist Discontinued Lyrica and added Gabapentin for neuropathy Diabetic diet plan discussed HBA1C will be rechecked, last was 7.5 (02/2021)  Nutrition: Stressed importance of moderation in sodium intake, saturated fat and cholesterol, caloric balance, sufficient intake of complex carbohydrates, fiber, calcium and iron.   Exercise: Stressed the importance of regular exercise.       Orders: -     CBC with Differential/Platelet -     Comprehensive metabolic panel -     TSH -     Hemoglobin A1c -     Gabapentin; Take 1 capsule (100 mg total) by mouth 2 (two) times daily.  Dispense: 90 capsule; Refill: 3  Malignant neoplasm of upper-outer quadrant of right breast in female, estrogen receptor positive ([facility]) Assessment & Plan: Follow to Dr Bobby Rumpf for continuity care and also follows to the Surgeon  Taking Arimidex 1 mg OD    Type 2 diabetes mellitus with hyperglycemia, with long-term current use of insulin ([facility]) Assessment & Plan: DM2 not well controlled Fasting runs between 145 to 255 Following up with Endocrinologist Dr Kelton Pillar Taking long acting Degludec 50 units and Novolog 14 units 3 times daily before food Tried Ozempic in past but discontinued due to side effects Patient was educated other options of medicines and me as a provider starting her diabetic medicines but she wants to talk first with endocrinologist Discontinued Lyrica and added Gabapentin for neuropathy Diabetic diet plan discussed HBA1C will be  rechecked, last was 7.5 (02/2021)   Nutrition:  Stressed importance of moderation in sodium intake, saturated fat and cholesterol, caloric balance, sufficient intake of complex carbohydrates, fiber, calcium and iron.   Exercise: Stressed the importance of regular exercise.       Meds ordered this encounter  Medications   gabapentin (NEURONTIN) 100 MG capsule    Sig: Take 1 capsule (100 mg total) by mouth 2 (two) times daily.    Dispense:  90 capsule    Refill:  3    Order Specific Question:   Supervising Provider    AnswerRochel Brome [phone]    Orders Placed This Encounter  Procedures   CBC with Differential/Platelet   Comprehensive metabolic panel   TSH   Hemoglobin A1c     Follow-up: Return in about 4 months (around 07/20/2022) for CHRONIC, FASTING. I, Neil Crouch have reviewed all documentation for this visit. The documentation on 03/21/22   for the exam, diagnosis, procedures, and orders are all accurate and complete.  An After Visit Summary was printed and given to the patient.  Neil Crouch, [place] [phone]

## 2022-03-21 NOTE — Assessment & Plan Note (Addendum)
DM2 not well controlled Fasting runs between 145 to 255 Following up with Endocrinologist Dr Kelton Pillar Taking long acting degludec 50 units and Novolog 14 units 3 times daily before food Tried Ozempic in past but discontinued due to side effects Patient was educated other options of medicines and me as a provider starting her diabetic medicines but she wants to talk first with endocrinologist Discontinued Lyrica and added Gabapentin for neuropathy Diabetic diet plan discussed HBA1C will be rechecked, last was 7.5 (02/2021)  Nutrition: Stressed importance of moderation in sodium intake, saturated fat and cholesterol, caloric balance, sufficient intake of complex carbohydrates, fiber, calcium and iron.   Exercise: Stressed the importance of regular exercise.

## 2022-03-21 NOTE — Assessment & Plan Note (Addendum)
DM2 not well controlled Fasting runs between 145 to 255 Following up with Endocrinologist Dr Kelton Pillar Taking long acting Degludec 50 units and Novolog 14 units 3 times daily before food Tried Ozempic in past but discontinued due to side effects Patient was educated other options of medicines and me as a provider starting her diabetic medicines but she wants to talk first with endocrinologist Discontinued Lyrica and added Gabapentin for neuropathy Diabetic diet plan discussed HBA1C will be rechecked, last was 7.5 (02/2021)   Nutrition: Stressed importance of moderation in sodium intake, saturated fat and cholesterol, caloric balance, sufficient intake of complex carbohydrates, fiber, calcium and iron.   Exercise: Stressed the importance of regular exercise.

## 2022-03-21 NOTE — Assessment & Plan Note (Signed)
Follow to Dr Bobby Rumpf for continuity care and also follows to the Surgeon  Taking Arimidex 1 mg OD

## 2022-03-21 NOTE — Patient Instructions (Signed)

## 2022-03-22 ENCOUNTER — Other Ambulatory Visit: Payer: Self-pay | Admitting: Nurse Practitioner

## 2022-03-22 DIAGNOSIS — E1165 Type 2 diabetes mellitus with hyperglycemia: Secondary | ICD-10-CM

## 2022-03-22 LAB — COMPREHENSIVE METABOLIC PANEL
ALT: 23 IU/L (ref 0–32)
AST: 20 IU/L (ref 0–40)
Albumin/Globulin Ratio: 1.7 (ref 1.2–2.2)
Albumin: 4.3 g/dL (ref 3.8–4.8)
Alkaline Phosphatase: 90 IU/L (ref 44–121)
BUN/Creatinine Ratio: 24 (ref 12–28)
BUN: 12 mg/dL (ref 8–27)
Bilirubin Total: 0.2 mg/dL (ref 0.0–1.2)
CO2: 25 mmol/L (ref 20–29)
Calcium: 10.1 mg/dL (ref 8.7–10.3)
Chloride: 101 mmol/L (ref 96–106)
Creatinine, Ser: 0.51 mg/dL — ABNORMAL LOW (ref 0.57–1.00)
Globulin, Total: 2.6 g/dL (ref 1.5–4.5)
Glucose: 135 mg/dL — ABNORMAL HIGH (ref 70–99)
Potassium: 4.6 mmol/L (ref 3.5–5.2)
Sodium: 143 mmol/L (ref 134–144)
Total Protein: 6.9 g/dL (ref 6.0–8.5)
eGFR: 98 mL/min/{1.73_m2} (ref >59)

## 2022-03-22 LAB — CBC WITH DIFFERENTIAL/PLATELET
Basophils Absolute: 0 10*3/uL (ref 0.0–0.2)
Basos: 1 %
EOS (ABSOLUTE): 0.1 10*3/uL (ref 0.0–0.4)
Eos: 1 %
Hematocrit: 43.5 % (ref 34.0–46.6)
Hemoglobin: 14.6 g/dL (ref 11.1–15.9)
Immature Grans (Abs): 0 10*3/uL (ref 0.0–0.1)
Immature Granulocytes: 0 %
Lymphocytes Absolute: 2.7 10*3/uL (ref 0.7–3.1)
Lymphs: 34 %
MCH: 30 pg (ref 26.6–33.0)
MCHC: 33.6 g/dL (ref 31.5–35.7)
MCV: 90 fL (ref 79–97)
Monocytes Absolute: 0.6 10*3/uL (ref 0.1–0.9)
Monocytes: 7 %
Neutrophils Absolute: 4.6 10*3/uL (ref 1.4–7.0)
Neutrophils: 57 %
Platelets: 248 10*3/uL (ref 150–450)
RBC: 4.86 x10E6/uL (ref 3.77–5.28)
RDW: 12.6 % (ref 11.7–15.4)
WBC: 8 10*3/uL (ref 3.4–10.8)

## 2022-03-22 LAB — HEMOGLOBIN A1C
Est. average glucose Bld gHb Est-mCnc: 217 mg/dL
Hgb A1c MFr Bld: 9.2 % — ABNORMAL HIGH (ref 4.8–5.6)

## 2022-03-22 LAB — TSH: TSH: 0.91 u[IU]/mL (ref 0.450–4.500)

## 2022-03-22 MED ORDER — TIRZEPATIDE 2.5 MG/0.5ML ~~LOC~~ SOAJ: 2.5000 mg | SUBCUTANEOUS | 0 refills | Status: DC

## 2022-03-22 MED ORDER — DAPAGLIFLOZIN PROPANEDIOL 10 MG PO TABS: 10.0000 mg | ORAL_TABLET | Freq: Every day | ORAL | 0 refills | Status: DC

## 2022-04-16 ENCOUNTER — Other Ambulatory Visit: Payer: Self-pay | Admitting: Nurse Practitioner

## 2022-04-16 DIAGNOSIS — E1165 Type 2 diabetes mellitus with hyperglycemia: Secondary | ICD-10-CM

## 2022-04-23 ENCOUNTER — Telehealth: Payer: Self-pay

## 2022-04-23 NOTE — Telephone Encounter (Signed)
Patient notified that mounjaro was approved.

## 2022-04-26 ENCOUNTER — Other Ambulatory Visit: Payer: Self-pay

## 2022-04-26 DIAGNOSIS — E1165 Type 2 diabetes mellitus with hyperglycemia: Secondary | ICD-10-CM

## 2022-04-26 MED ORDER — TIRZEPATIDE 2.5 MG/0.5ML ~~LOC~~ SOAJ: 2.5000 mg | SUBCUTANEOUS | 0 refills | Status: DC

## 2022-05-03 ENCOUNTER — Ambulatory Visit (INDEPENDENT_AMBULATORY_CARE_PROVIDER_SITE_OTHER): Payer: PPO | Admitting: Internal Medicine

## 2022-05-03 ENCOUNTER — Encounter: Payer: Self-pay | Admitting: Internal Medicine

## 2022-05-03 VITALS — BP 128/76 | HR 78 | Ht 62.5 in | Wt 182.6 lb

## 2022-05-03 DIAGNOSIS — E1165 Type 2 diabetes mellitus with hyperglycemia: Secondary | ICD-10-CM | POA: Diagnosis not present

## 2022-05-03 MED ORDER — INSULIN PEN NEEDLE 32G X 4 MM MISC: 1.0000 | Freq: Four times a day (QID) | 3 refills | Status: DC

## 2022-05-03 MED ORDER — TIRZEPATIDE 5 MG/0.5ML ~~LOC~~ SOAJ: 5.0000 mg | SUBCUTANEOUS | 3 refills | Status: DC

## 2022-05-03 MED ORDER — NOVOLOG FLEXPEN 100 UNIT/ML ~~LOC~~ SOPN: 14.0000 [IU] | PEN_INJECTOR | Freq: Three times a day (TID) | SUBCUTANEOUS | 3 refills | Status: DC

## 2022-05-03 MED ORDER — INSULIN DEGLUDEC FLEXTOUCH 200 UNIT/ML ~~LOC~~ SOPN: 48.0000 [IU] | PEN_INJECTOR | Freq: Every day | SUBCUTANEOUS | 4 refills | Status: DC

## 2022-05-03 NOTE — Progress Notes (Signed)
Name: Heather Waller  Age/ Sex: 72 y.o., female   MRN/ DOB: [id number], February 19, 1950     PCP: Neil Crouch, FNP   Reason for Endocrinology Evaluation: Type 2 Diabetes Mellitus  Initial Endocrine Consultative Visit: 02/23/2020    PATIENT IDENTIFIER: Heather Waller is a 71 y.o. female with a past medical history of T2DM, HTN , Hx breast CA (status postlumpectomy 07/2020 )and dyslipidemia. The patient has followed with Endocrinology clinic since 02/23/2020 for consultative assistance with management of her diabetes.     DIABETIC HISTORY:  Heather Waller was diagnosed with DM yrs ago,Intolerant to Metformin due to diarrhea .  Her hemoglobin A1c has ranged from 10.7% in 2021, peaking at 10.8%  in 2022.    On her initial visit to our clinic she had an A1c of 10.8%, she was on [place] which we continued and added prandial insulin.  She was started on Farxiga by her PCP 03/2022 with an A1c 9.2% but it was out of stock, so she was started on Mounjaro    SUBJECTIVE:   During the last visit (11/02/2021): A1c 7.5 %      Today (05/03/2022): Heather Waller is here for a follow up on diabetes management.  She checks her blood sugars 3  times daily. The patient has not had hypoglycemic episodes since the last clinic visit.   She continues to follow-up with oncology for history of breast cancer, she remains disease-free, she continues to be on trastuzumab infusions 03/18/2022  She has been out of Ozempic since end of 2023 which results in hyperglycemia   Denies diarrhea or abdominal pain     HOME DIABETES REGIMEN:  Mounjaro  2.5 mg weekly Tresiba 48 units daily NovoLog 14 units      Statin: Declines (02/2020) ACE-I/ARB: Yes    Unable to Download: unable to download Fasting 124 -174 mg/dL  Lunch 178-214  Supper 148- 160    DIABETIC COMPLICATIONS: Microvascular complications:  Neuropathy- chemo related  Denies: CKD, retinopathy Last Eye Exam:  Completed 11/2021  Macrovascular complications:   Denies: CAD, CVA, PVD   HISTORY:  Past Medical History:  Past Medical History:  Diagnosis Date   Diabetes mellitus without complication ([place])    Hyperlipidemia    Hypertension    Type 2 diabetes mellitus with hyperglycemia ([place]) 02/23/2020   Type 2 diabetes mellitus without complication, with long-term current use of insulin ([place]) 12/29/2020   Past Surgical History: No past surgical history on file. Social History:  reports that she has never smoked. She has never used smokeless tobacco. She reports that she does not currently use alcohol. No history on file for drug use. Family History:  Family History  Problem Relation Age of Onset   Alzheimer's disease Mother    Hypertension Father      HOME MEDICATIONS: Allergies as of 05/03/2022       Reactions   Latex Itching   Codeine    Iodine    Oxycodone         Medication List        Accurate as of May 03, 2022 10:12 AM. If you have any questions, ask your nurse or doctor.          anastrozole 1 MG tablet Commonly known as: ARIMIDEX Take 1 tablet (1 mg total) by mouth daily.   dapagliflozin propanediol 10 MG Tabs tablet Commonly known as: Farxiga Take 1 tablet (10 mg total) by mouth daily before breakfast.   FreeStyle [employer]  2 Sensor Misc 1 Device by Does not apply route as directed.   gabapentin 100 MG capsule Commonly known as: NEURONTIN Take 1 capsule (100 mg total) by mouth 2 (two) times daily.   Insulin Degludec FlexTouch 200 UNIT/ML Sopn INJECT 50 UNITS INTO THE SKIN DAILY IN THE AFTERNOON.   Insulin Pen Needle 32G X 4 MM Misc 1 Device by Does not apply route in the morning, at noon, in the evening, and at bedtime.   NovoLOG FlexPen 100 UNIT/ML FlexPen Generic drug: insulin aspart Inject 14 Units into the skin with breakfast, with lunch, and with evening meal.   tirzepatide 2.5 MG/0.5ML Pen Commonly known as: MOUNJARO Inject 2.5 mg into the  skin once a week.   valsartan-hydrochlorothiazide 80-12.5 MG tablet Commonly known as: DIOVAN-HCT Take 1 tablet by mouth daily.         OBJECTIVE:   Vital Signs: BP 128/76 (BP Location: Left Arm, Patient Position: Sitting, Cuff Size: Large)   Pulse 78   Ht 5' 2.5" (1.588 m)   Wt 182 lb 9.6 oz (82.8 kg)   SpO2 95%   BMI 32.87 kg/m   Wt Readings from Last 3 Encounters:  05/03/22 182 lb 9.6 oz (82.8 kg)  03/21/22 183 lb (83 kg)  03/18/22 184 lb 6.4 oz (83.6 kg)     Exam: General: Pt appears well and is in NAD  Lungs: Clear with good BS bilat   Heart: RRR   Abdomen: Normoactive bowel sounds, soft, nontender, without masses or organomegaly palpable  Extremities: No   pretibial edema.   Neuro: MS is good with appropriate affect, pt is alert and Ox3    DM foot exam:05/03/2022     The skin of the feet is intact without sores or ulcerations. The pedal pulses are 2+ on right and 2+ on left. The sensation is intact to a screening 5.07, 10 gram monofilament bilaterally    DATA REVIEWED:  Lab Results  Component Value Date   HGBA1C 9.2 (H) 03/21/2022   HGBA1C 7.5 (A) 11/02/2021   HGBA1C 7.6 (A) 04/27/2021    Latest Reference Range & Units 03/21/22 11:29  Sodium 134 - 144 mmol/L 143  Potassium 3.5 - 5.2 mmol/L 4.6  Chloride 96 - 106 mmol/L 101  CO2 20 - 29 mmol/L 25  Glucose 70 - 99 mg/dL 135 (H)  BUN 8 - 27 mg/dL 12  Creatinine 0.57 - 1.00 mg/dL 0.51 (L)  Calcium 8.7 - 10.3 mg/dL 10.1  BUN/Creatinine Ratio 12 - 28  24  eGFR >59 mL/min/1.73 98  Alkaline Phosphatase 44 - 121 IU/L 90  Albumin 3.8 - 4.8 g/dL 4.3  Albumin/Globulin Ratio 1.2 - 2.2  1.7  AST 0 - 40 IU/L 20  ALT 0 - 32 IU/L 23  Total Protein 6.0 - 8.5 g/dL 6.9  Total Bilirubin 0.0 - 1.2 mg/dL 0.2   Old records , labs and images have been reviewed.   ASSESSMENT / PLAN / RECOMMENDATIONS:   1) Type 2 Diabetes Mellitus, Poorly  controlled, Without complications - Most recent A1c of  9.2%. Goal A1c <  7.0 %.     -Patient has been noted with worsening glycemic control, A1c increased from 7.5% to 9.2%, she attributes this due to self discontinuation of Ozempic after the holidays -Her PCP recently attempted to put her on Farxiga but her pharmacy did not have it, she was started on [facility] and she feels that this is much better tolerated than the Ozempic that was given  her abdominal pain - She has issues with learning and maneuvering CGM in the past and opted with finger sticks  -Patient advised to contact our office with hypoglycemia so we can adjust her insulin accordingly  MEDICATIONS: Increase  Mounjaro 5 mg weekly  Continue   Tresiba 48 units daily Continue  NovoLog 14 units with each meal   EDUCATION / INSTRUCTIONS: BG monitoring instructions: Patient is instructed to check her blood sugars 3 times a day, before meals . Call [place] [facility] if: BG persistently < 70  I reviewed the Rule of 15 for the treatment of hypoglycemia in detail with the patient. Literature supplied.   2) Diabetic complications:  Eye: Does not have known diabetic retinopathy.  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  Renal: Patient does not have known baseline CKD. She   is  on an ACEI/ARB at present.    F/U in 4 months     Signed electronically by: Mack Guise, MD  [facility]  [address] [facility] [address] [place], [place] [address] Phone: [phone] FAX: [phone]   CC: Neil Crouch, FNP [address] [place] [address] Phone: [phone]  Fax: [phone]  Return to Endocrinology clinic as below: Future Appointments  Date Time Provider [place]  06/14/2022 11:00 AM [facility] CONSULT RM [facility] CE  07/17/2022 10:45 AM [name], MD [facility] None  07/24/2022  8:00 AM Cletus Gash, PA [facility] None

## 2022-05-03 NOTE — Patient Instructions (Signed)
Increase Mounjaro 5 mg once weekly  Continue  Tresiba  48  units daily  Continue Novolog 14 units with each meal       HOW TO TREAT LOW BLOOD SUGARS (Blood sugar LESS THAN 70 MG/DL) Please follow the RULE OF 15 for the treatment of hypoglycemia treatment (when your (blood sugars are less than 70 mg/dL)   STEP 1: Take 15 grams of carbohydrates when your blood sugar is low, which includes:  3-4 GLUCOSE TABS  OR 3-4 OZ OF JUICE OR REGULAR SODA OR ONE TUBE OF GLUCOSE GEL    STEP 2: RECHECK blood sugar in 15 MINUTES STEP 3: If your blood sugar is still low at the 15 minute recheck --> then, go back to STEP 1 and treat AGAIN with another 15 grams of carbohydrate

## 2022-06-14 ENCOUNTER — Ambulatory Visit
Admission: RE | Admit: 2022-06-14 | Discharge: 2022-06-14 | Disposition: A | Discharge status: Home / Self Care | Payer: PPO | Source: Ambulatory Visit | Attending: Surgery | Admitting: Surgery

## 2022-06-14 ENCOUNTER — Ambulatory Visit
Admission: RE | Admit: 2022-06-14 | Discharge: 2022-06-14 | Disposition: A | Discharge status: Home / Self Care | Payer: PPO | Source: Ambulatory Visit | Attending: Oncology | Admitting: Oncology

## 2022-06-14 DIAGNOSIS — Z170 Estrogen receptor positive status [ER+]: Secondary | ICD-10-CM

## 2022-06-14 DIAGNOSIS — Z9889 Other specified postprocedural states: Secondary | ICD-10-CM

## 2022-06-14 HISTORY — DX: Personal history of irradiation: Z92.3

## 2022-06-14 HISTORY — DX: Personal history of antineoplastic chemotherapy: Z92.21

## 2022-06-14 HISTORY — DX: Malignant neoplasm of unspecified site of unspecified female breast: C50.919

## 2022-06-19 ENCOUNTER — Other Ambulatory Visit: Payer: Self-pay | Admitting: Surgery

## 2022-06-19 DIAGNOSIS — Z853 Personal history of malignant neoplasm of breast: Secondary | ICD-10-CM

## 2022-06-19 DIAGNOSIS — Z1231 Encounter for screening mammogram for malignant neoplasm of breast: Secondary | ICD-10-CM

## 2022-07-17 ENCOUNTER — Ambulatory Visit: Payer: PPO | Admitting: Oncology

## 2022-07-17 NOTE — Progress Notes (Unsigned)
[facility]  [address] [place],  [place]  [address] [phone]  Clinic Day:  07/18/2022  Referring physician: Tamsen Snider, NP  HISTORY OF PRESENT ILLNESS:  The patient is a 72 y.o. female with stage IA (T1c N0 M0) hormone/Her2 positive right breast cancer, status post a lumpectomy in June 2022.  She completed 6 cycles of adjuvant TCH chemotherapy in October 2022. She completed her maintenance Herceptin in June 2023.  She remains on anastrozole for her adjuvant endocrine therapy.  She comes in today for routine follow-up.  Since her last visit, the patient has been doing well.  She denies having any particular changes in her breasts which concern her for disease recurrence.  Of note, her annual mammogram done in May 2024 showed no evidence of disease recurrence.  VITALS:  Blood pressure (!) 174/76, pulse 77, temperature 98.3 F (36.8 C), resp. rate 16, height 5' 2.5" (1.588 m), weight 179 lb 9.6 oz (81.5 kg), SpO2 94 %.  Wt Readings from Last 3 Encounters:  07/18/22 179 lb 9.6 oz (81.5 kg)  05/03/22 182 lb 9.6 oz (82.8 kg)  03/21/22 183 lb (83 kg)    Body mass index is 32.33 kg/m.  Performance status (ECOG): 1  PHYSICAL EXAM:  Physical Exam Constitutional:      General: She is not in acute distress.    Appearance: Normal appearance. She is normal weight.  HENT:     Head: Normocephalic and atraumatic.     Mouth/Throat:     Pharynx: Oropharynx is clear. No oropharyngeal exudate.  Eyes:     General: No scleral icterus.    Extraocular Movements: Extraocular movements intact.     Conjunctiva/sclera: Conjunctivae normal.     Pupils: Pupils are equal, round, and reactive to light.  Cardiovascular:     Rate and Rhythm: Normal rate and regular rhythm.     Pulses: Normal pulses.     Heart sounds: Normal heart sounds. No murmur heard.    No friction rub. No gallop.  Pulmonary:     Effort: Pulmonary effort is normal. No respiratory  distress.     Breath sounds: Normal breath sounds.  Chest:  Breasts:    Right: No swelling, bleeding, inverted nipple, mass, nipple discharge or skin change.     Left: No swelling, bleeding, inverted nipple, mass, nipple discharge or skin change.  Abdominal:     General: Bowel sounds are normal. There is no distension.     Palpations: Abdomen is soft. There is no hepatomegaly, splenomegaly or mass.     Tenderness: There is no abdominal tenderness.  Musculoskeletal:        General: No tenderness. Normal range of motion.     Cervical back: Normal range of motion and neck supple.     Right lower leg: No edema.     Left lower leg: No edema.  Lymphadenopathy:     Cervical: No cervical adenopathy.     Right cervical: No superficial, deep or posterior cervical adenopathy.    Left cervical: No superficial, deep or posterior cervical adenopathy.     Upper Body:     Right upper body: No supraclavicular or axillary adenopathy.     Left upper body: No supraclavicular or axillary adenopathy.     Lower Body: No right inguinal adenopathy. No left inguinal adenopathy.  Skin:    General: Skin is warm and dry.     Coloration: Skin is not jaundiced.     Findings:  No lesion or rash.  Neurological:     General: No focal deficit present.     Mental Status: She is alert and oriented to person, place, and time. Mental status is at baseline.  Psychiatric:        Mood and Affect: Mood normal.        Behavior: Behavior normal.        Thought Content: Thought content normal.        Judgment: Judgment normal.    ASSESSMENT & PLAN:  Assessment/Plan:  A  72 y.o. female with stage IA (T1c N0 M0) hormone/Her2 positive breast cancer, status post a lumpectomy in June 2022, followed by [facility] x 6 and maintenance Herceptin x 1 year.  Based upon her clinical breast exam today and recent mammogram, the patient remains disease-free.  She knows to continue taking her anastrozole daily x 5 years for her adjuvant endocrine  therapy.  Overall, she appears to be doing well.  I do feel comfortable  spacing all of this patient's future appointments out to every 6 months.  The patient understands all the plans discussed today and is in agreement with them.    Cathie Bonnell Kirby Funk, MD

## 2022-07-18 ENCOUNTER — Inpatient Hospital Stay: Payer: PPO | Attending: Oncology | Admitting: Oncology

## 2022-07-18 VITALS — BP 174/76 | HR 77 | Temp 98.3°F | Resp 16 | Ht 62.5 in | Wt 179.6 lb

## 2022-07-18 DIAGNOSIS — Z170 Estrogen receptor positive status [ER+]: Secondary | ICD-10-CM | POA: Diagnosis not present

## 2022-07-18 DIAGNOSIS — C50411 Malignant neoplasm of upper-outer quadrant of right female breast: Secondary | ICD-10-CM

## 2022-07-24 ENCOUNTER — Ambulatory Visit (INDEPENDENT_AMBULATORY_CARE_PROVIDER_SITE_OTHER): Payer: PPO | Admitting: Physician Assistant

## 2022-07-24 ENCOUNTER — Encounter: Payer: Self-pay | Admitting: Physician Assistant

## 2022-07-24 VITALS — BP 154/68 | HR 72 | Temp 97.0°F | Resp 16 | Ht 64.5 in | Wt 178.0 lb

## 2022-07-24 DIAGNOSIS — E1165 Type 2 diabetes mellitus with hyperglycemia: Secondary | ICD-10-CM | POA: Diagnosis not present

## 2022-07-24 DIAGNOSIS — E785 Hyperlipidemia, unspecified: Secondary | ICD-10-CM

## 2022-07-24 DIAGNOSIS — E1159 Type 2 diabetes mellitus with other circulatory complications: Secondary | ICD-10-CM

## 2022-07-24 DIAGNOSIS — I152 Hypertension secondary to endocrine disorders: Secondary | ICD-10-CM

## 2022-07-24 DIAGNOSIS — Z794 Long term (current) use of insulin: Secondary | ICD-10-CM

## 2022-07-24 DIAGNOSIS — M65341 Trigger finger, right ring finger: Secondary | ICD-10-CM | POA: Diagnosis not present

## 2022-07-24 LAB — COMPREHENSIVE METABOLIC PANEL
ALT: 15 IU/L (ref 0–32)
AST: 19 IU/L (ref 0–40)
Albumin/Globulin Ratio: 1.8
Albumin: 4.5 g/dL (ref 3.8–4.8)
Alkaline Phosphatase: 78 IU/L (ref 44–121)
BUN/Creatinine Ratio: 13 (ref 12–28)
BUN: 9 mg/dL (ref 8–27)
Bilirubin Total: 0.4 mg/dL (ref 0.0–1.2)
CO2: 26 mmol/L (ref 20–29)
Calcium: 9.9 mg/dL (ref 8.7–10.3)
Chloride: 97 mmol/L (ref 96–106)
Creatinine, Ser: 0.68 mg/dL (ref 0.57–1.00)
Globulin, Total: 2.5 g/dL (ref 1.5–4.5)
Glucose: 223 mg/dL — ABNORMAL HIGH (ref 70–99)
Potassium: 5 mmol/L (ref 3.5–5.2)
Sodium: 138 mmol/L (ref 134–144)
Total Protein: 7 g/dL (ref 6.0–8.5)
eGFR: 92 mL/min/{1.73_m2} (ref >59)

## 2022-07-24 LAB — LIPID PANEL
Chol/HDL Ratio: 3.1 ratio (ref 0.0–4.4)
Cholesterol, Total: 177 mg/dL (ref 100–199)
HDL: 58 mg/dL (ref >39)
LDL Chol Calc (NIH): 95 mg/dL (ref 0–99)
Triglycerides: 137 mg/dL (ref 0–149)
VLDL Cholesterol Cal: 24 mg/dL (ref 5–40)

## 2022-07-24 LAB — CBC WITH DIFFERENTIAL/PLATELET
Basophils Absolute: 0 10*3/uL (ref 0.0–0.2)
Basos: 0 %
EOS (ABSOLUTE): 0.1 10*3/uL (ref 0.0–0.4)
Eos: 1 %
Hematocrit: 45.1 % (ref 34.0–46.6)
Hemoglobin: 15.1 g/dL (ref 11.1–15.9)
Immature Grans (Abs): 0 10*3/uL (ref 0.0–0.1)
Immature Granulocytes: 0 %
Lymphocytes Absolute: 2.1 10*3/uL (ref 0.7–3.1)
Lymphs: 23 %
MCH: 30 pg (ref 26.6–33.0)
MCHC: 33.5 g/dL (ref 31.5–35.7)
MCV: 90 fL (ref 79–97)
Monocytes Absolute: 0.4 10*3/uL (ref 0.1–0.9)
Monocytes: 5 %
Neutrophils Absolute: 6.3 10*3/uL (ref 1.4–7.0)
Neutrophils: 71 %
Platelets: 258 10*3/uL (ref 150–450)
RBC: 5.03 x10E6/uL (ref 3.77–5.28)
RDW: 13.1 % (ref 11.7–15.4)
WBC: 8.9 10*3/uL (ref 3.4–10.8)

## 2022-07-24 LAB — HEMOGLOBIN A1C
Est. average glucose Bld gHb Est-mCnc: 189 mg/dL
Hgb A1c MFr Bld: 8.2 % — ABNORMAL HIGH (ref 4.8–5.6)

## 2022-07-24 MED ORDER — VALSARTAN-HYDROCHLOROTHIAZIDE 80-12.5 MG PO TABS
1.0000 | ORAL_TABLET | Freq: Every day | ORAL | 3 refills | Status: DC
Start: 2022-07-24 — End: 2023-07-09

## 2022-07-24 MED ORDER — OZEMPIC (0.25 OR 0.5 MG/DOSE) 2 MG/3ML ~~LOC~~ SOPN
0.2500 mg | PEN_INJECTOR | SUBCUTANEOUS | 2 refills | Status: DC
Start: 2022-07-24 — End: 2022-11-07

## 2022-07-24 NOTE — Assessment & Plan Note (Signed)
Uncontrolled Labs drawn today Unable to get [facility] due to shortage Changed to Ozempic .25mg   Continue on the Novolog 100units

## 2022-07-24 NOTE — Assessment & Plan Note (Signed)
Uncontrolled Patient stated it is normally high when she gets here but is able to return to normal range at home Discussed possible needing to change the medication if it continues to stay high. Patient agreed

## 2022-07-24 NOTE — Assessment & Plan Note (Signed)
Discussed steroid injection to help with the swelling. Patient wanted to try OTC medication with ice to see if that would help with the swelling. We will continue to monitor and check at her next appointment if she wants to have an injection or not.

## 2022-07-24 NOTE — Assessment & Plan Note (Signed)
Diet controlled Continue working on diet and exercise Labs drawn today Will adjust with medication if needed depending on labs

## 2022-07-24 NOTE — Progress Notes (Signed)
Subjective:  Patient ID: Heather Waller, female    DOB: 19-May-1950  Age: 72 y.o. MRN: [id number]  Chief Complaint  Patient presents with   Medical Management of Chronic Issues    HPI Patient reports issues with getting her Joycelyn Man and states that the pharmacies around here have had difficulty stocking it so she would like to go back to Ozempic since that one seems to be more available. We will restart her on the starter does since the last time she took it was in November of 2022  Patient reports a nodule in the palm of her right hand that she has started to notice. She denies feeling it catch or get stuck. Patient stated it started happen over the past few weeks. Denies any pain with it or decreased ROM.  Diabetes:  Complications: Getting the medicine Glucose checking: three times daily Glucose logs: 135-250  Hypoglycemia: none  Most recent A1C: 9.2 Current medications:  Novolog 14 units three times daily, Tresiba 48 units daily.  Greggory Keen has not been available recently.   Last Eye Exam: October 2023  Foot checks: daily   Hypertension: Complications: Current medications: Valsartan -HCT 80-12.5 daily.   Diet: Exercise:      07/24/2022    8:15 AM 03/21/2022   11:12 AM  Depression screen PHQ 2/9  Decreased Interest 0 0  Down, Depressed, Hopeless 0 0  PHQ - 2 Score 0 0  Altered sleeping 0   Tired, decreased energy 0   Change in appetite 0   Feeling bad or failure about yourself  0   Trouble concentrating 0   Moving slowly or fidgety/restless 0   Suicidal thoughts 0   PHQ-9 Score 0   Difficult doing work/chores Not difficult at all         07/24/2022    8:15 AM  Fall Risk   Falls in the past year? 0  Number falls in past yr: 0  Injury with Fall? 0  Risk for fall due to : No Fall Risks  Follow up Falls evaluation completed;Falls prevention discussed    Patient Care Team: Langley Gauss, [place] as PCP - General (Physician Assistant) Weston Settle, MD as  Consulting Physician (Oncology) Darleene Cleaver, MD as Consulting Physician (General Surgery)   Review of Systems  Constitutional:  Negative for chills, fatigue and fever.  HENT:  Negative for congestion, rhinorrhea and sore throat.   Respiratory:  Negative for cough and shortness of breath.   Cardiovascular:  Negative for chest pain.  Gastrointestinal:  Negative for abdominal pain, constipation, diarrhea, nausea and vomiting.  Genitourinary:  Negative for dysuria and urgency.  Musculoskeletal:  Negative for back pain and myalgias.  Skin:        Nodule right hand   Neurological:  Positive for numbness (neuropathy since chemo). Negative for dizziness, weakness, light-headedness and headaches.  Psychiatric/Behavioral:  Negative for dysphoric mood. The patient is not nervous/anxious.     Current Outpatient Medications on File Prior to Visit  Medication Sig Dispense Refill   anastrozole (ARIMIDEX) 1 MG tablet Take 1 tablet (1 mg total) by mouth daily. 90 tablet 3   gabapentin (NEURONTIN) 100 MG capsule Take 1 capsule (100 mg total) by mouth 2 (two) times daily. 90 capsule 3   insulin aspart (NOVOLOG FLEXPEN) 100 UNIT/ML FlexPen Inject 14 Units into the skin with breakfast, with lunch, and with evening meal. Max daily 60 units 60 mL 3   Insulin Degludec FlexTouch 200 UNIT/ML SOPN 48  Units by Other route daily in the afternoon. 45 mL 4   Insulin Pen Needle 32G X 4 MM MISC 1 Device by Does not apply route in the morning, at noon, in the evening, and at bedtime. 400 each 3   No current facility-administered medications on file prior to visit.   Past Medical History:  Diagnosis Date   Breast cancer (HCC)    Diabetes mellitus without complication (HCC)    Hyperlipidemia    Hypertension    Personal history of chemotherapy    Personal history of radiation therapy    Type 2 diabetes mellitus with hyperglycemia (HCC) 02/23/2020   Type 2 diabetes mellitus without complication, with long-term  current use of insulin (HCC) 12/29/2020   Past Surgical History:  Procedure Laterality Date   BREAST LUMPECTOMY Right 07/2020    Family History  Problem Relation Age of Onset   Alzheimer's disease Mother    Hypertension Father    Social History   Socioeconomic History   Marital status: Married    Spouse name: Not on file   Number of children: Not on file   Years of education: Not on file   Highest education level: Not on file  Occupational History   Not on file  Tobacco Use   Smoking status: Never   Smokeless tobacco: Never  Substance and Sexual Activity   Alcohol use: Not Currently   Drug use: Not on file   Sexual activity: Not on file  Other Topics Concern   Not on file  Social History Narrative   Not on file   Social Determinants of Health   Financial Resource Strain: Not on file  Food Insecurity: Not on file  Transportation Needs: Not on file  Physical Activity: Not on file  Stress: Not on file  Social Connections: Not on file    Objective:  BP (!) 154/68   Pulse 72   Temp (!) 97 F (36.1 C)   Resp 16   Ht 5' 4.5" (1.638 m)   Wt 178 lb (80.7 kg)   BMI 30.08 kg/m      07/24/2022    8:05 AM 07/18/2022    9:15 AM 05/03/2022   10:02 AM  BP/Weight  Systolic BP 154 174 128  Diastolic BP 68 76 76  Wt. (Lbs) 178 179.6 182.6  BMI 30.08 kg/m2 32.33 kg/m2 32.87 kg/m2    Physical Exam Vitals reviewed.  Constitutional:      Appearance: Normal appearance.  Cardiovascular:     Rate and Rhythm: Normal rate and regular rhythm.     Heart sounds: Normal heart sounds.  Pulmonary:     Effort: Pulmonary effort is normal.     Breath sounds: Normal breath sounds.  Abdominal:     General: Bowel sounds are normal.     Palpations: Abdomen is soft.     Tenderness: There is no abdominal tenderness.  Musculoskeletal:     Right hand: Swelling present. No tenderness or bony tenderness. Normal range of motion. Normal strength.     Left hand: Normal.     Comments:  Trigger finger on the 4th digit of the right hand  Neurological:     Mental Status: She is alert and oriented to person, place, and time.  Psychiatric:        Mood and Affect: Mood normal.        Behavior: Behavior normal.     Diabetic Foot Exam - Simple   Simple Foot Form Diabetic Foot exam was  performed with the following findings: Yes 07/24/2022  8:15 AM  Visual Inspection No deformities, no ulcerations, no other skin breakdown bilaterally: Yes Sensation Testing Intact to touch and monofilament testing bilaterally: Yes Pulse Check Posterior Tibialis and Dorsalis pulse intact bilaterally: Yes Comments      Lab Results  Component Value Date   WBC 8.0 03/21/2022   HGB 14.6 03/21/2022   HCT 43.5 03/21/2022   PLT 248 03/21/2022   GLUCOSE 135 (H) 03/21/2022   ALT 23 03/21/2022   AST 20 03/21/2022   NA 143 03/21/2022   K 4.6 03/21/2022   CL 101 03/21/2022   CREATININE 0.51 (L) 03/21/2022   BUN 12 03/21/2022   CO2 25 03/21/2022   TSH 0.910 03/21/2022   HGBA1C 9.2 (H) 03/21/2022      Assessment & Plan:    Type 2 diabetes mellitus with hyperglycemia, with long-term current use of insulin (HCC) Assessment & Plan: Uncontrolled Labs drawn today Unable to get Monjaro due to shortage Changed to Ozempic .25mg   Continue on the Novolog 100units  Orders: -     Hemoglobin A1c -     Ozempic (0.25 or 0.5 MG/DOSE); Inject 0.25 mg into the skin once a week.  Dispense: 3 mL; Refill: 2  Dyslipidemia Assessment & Plan: Diet controlled Continue working on diet and exercise Labs drawn today Will adjust with medication if needed depending on labs   Orders: -     Lipid panel  Hypertension associated with diabetes (HCC) Assessment & Plan: Uncontrolled Patient stated it is normally high when she gets here but is able to return to normal range at home Discussed possible needing to change the medication if it continues to stay high. Patient agreed  Orders: -      Valsartan-hydroCHLOROthiazide; Take 1 tablet by mouth daily.  Dispense: 90 tablet; Refill: 3 -     CBC with Differential/Platelet -     Comprehensive metabolic panel  Trigger finger, right ring finger Assessment & Plan: Discussed steroid injection to help with the swelling. Patient wanted to try OTC medication with ice to see if that would help with the swelling. We will continue to monitor and check at her next appointment if she wants to have an injection or not.       Meds ordered this encounter  Medications   valsartan-hydrochlorothiazide (DIOVAN-HCT) 80-12.5 MG tablet    Sig: Take 1 tablet by mouth daily.    Dispense:  90 tablet    Refill:  3   Semaglutide,0.25 or 0.5MG /DOS, (OZEMPIC, 0.25 OR 0.5 MG/DOSE,) 2 MG/3ML SOPN    Sig: Inject 0.25 mg into the skin once a week.    Dispense:  3 mL    Refill:  2    Orders Placed This Encounter  Procedures   CBC with Differential/Platelet   Comprehensive metabolic panel   Hemoglobin A1c   Lipid panel     Follow-up: Return in about 3 months (around 10/24/2022) for Chronic, fasting, Huston Foley.   I,Carolyn M Morrison,acting as a Neurosurgeon for [employer], PA.,have documented all relevant documentation on the behalf of Langley Gauss, PA,as directed by  Langley Gauss, PA while in the presence of Langley Gauss, [place].   An After Visit Summary was printed and given to the patient.  Langley Gauss, [place] [facility] [phone]

## 2022-08-19 ENCOUNTER — Telehealth: Payer: Self-pay

## 2022-08-19 NOTE — Telephone Encounter (Signed)
PA for Ozempic was approved.

## 2022-09-19 IMAGING — MG DIGITAL DIAGNOSTIC BILAT W/ TOMO W/ CAD
8 of 13 series · 8 of 37 positions shown · non-contrast
Comparison: Multiple prior studies

CLINICAL DATA: History of RIGHT lumpectomy with radiation and
chemotherapy in Sunday July, 2020.

EXAM:
DIGITAL DIAGNOSTIC BILATERAL MAMMOGRAM WITH TOMOSYNTHESIS AND CAD
TECHNIQUE: Bilateral digital diagnostic mammography and breast tomosynthesis
was performed. The images were evaluated with computer-aided
detection.

[R MLO]
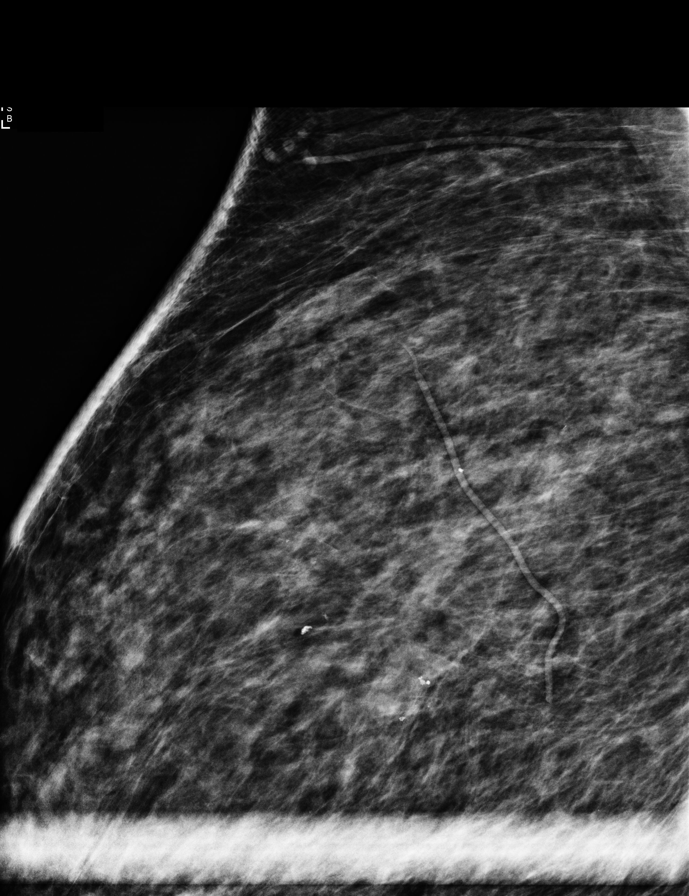

[R MLO synth-2D (1 of 2)]
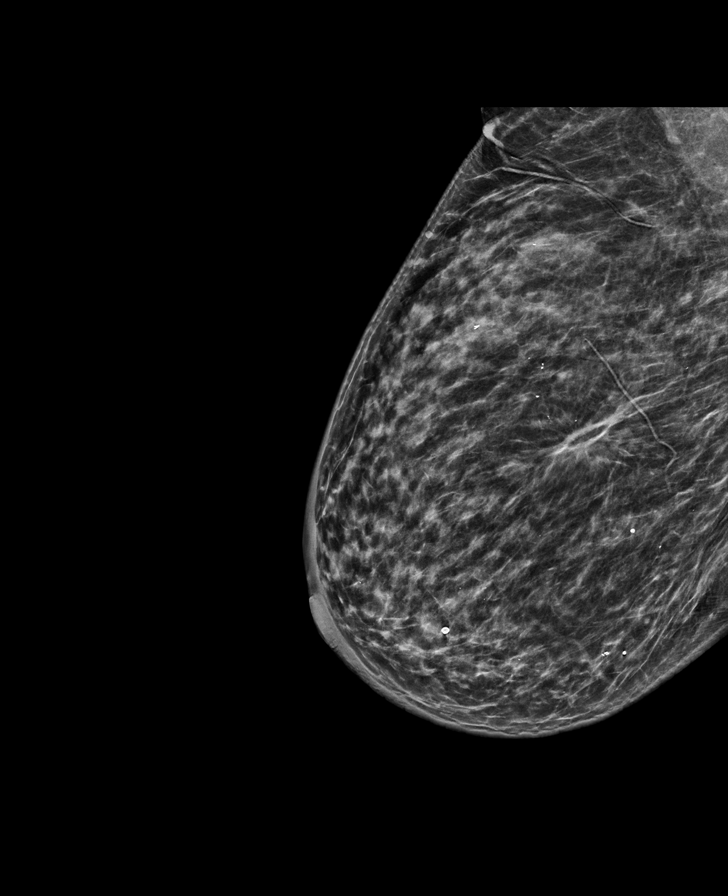

[L MLO synth-2D (1 of 2)]
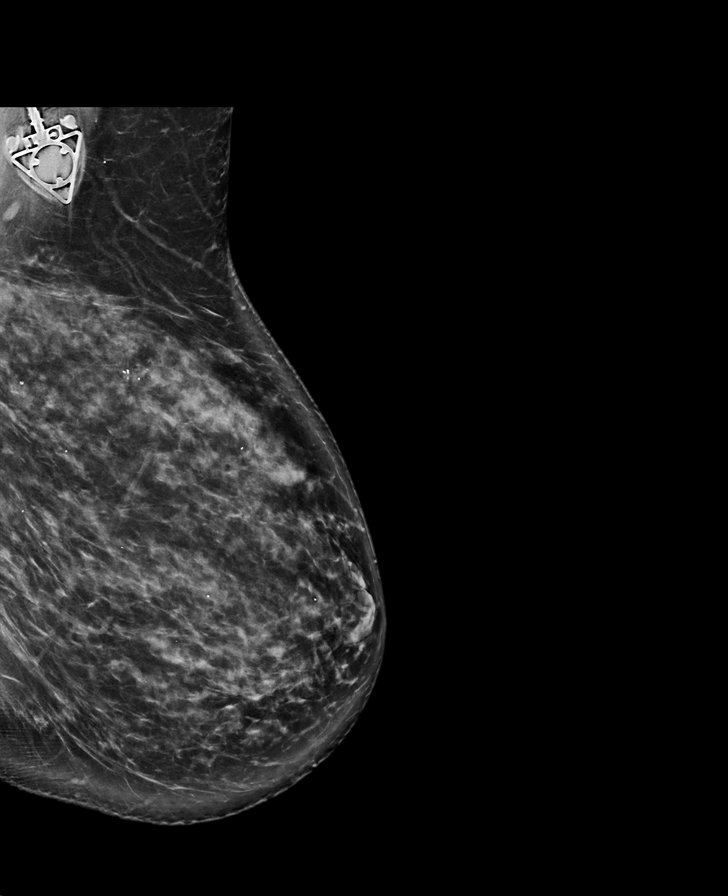

[R MLO synth-2D (2 of 2)]
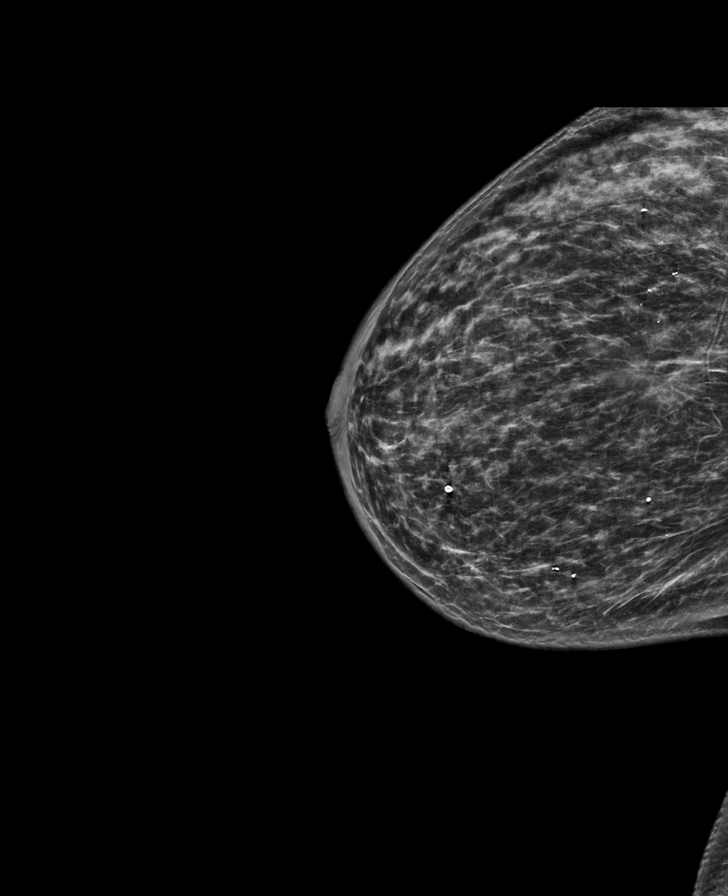

[L MLO synth-2D (2 of 2)]
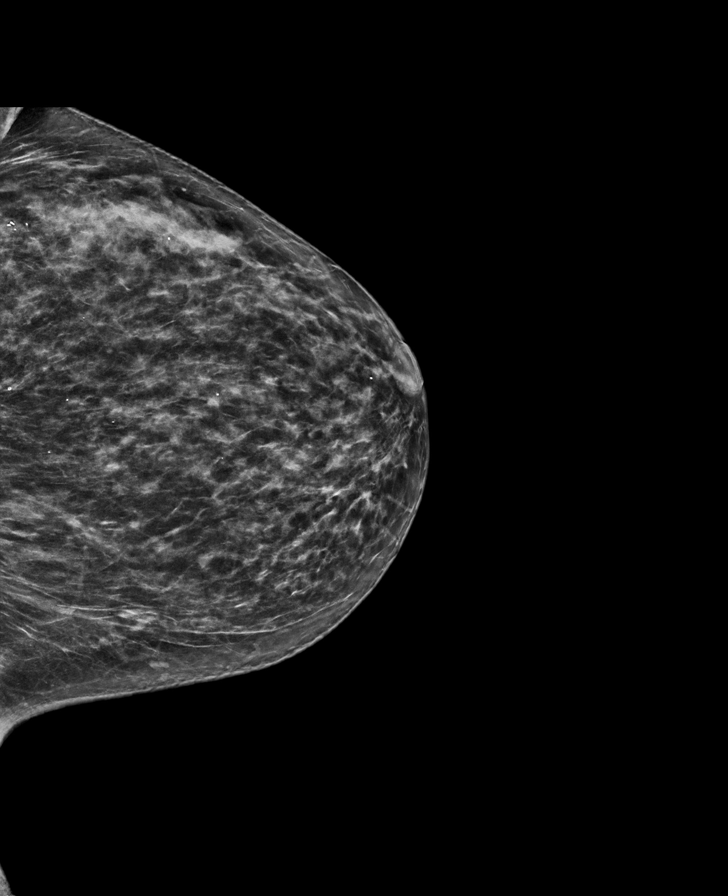

[L CC synth-2D]
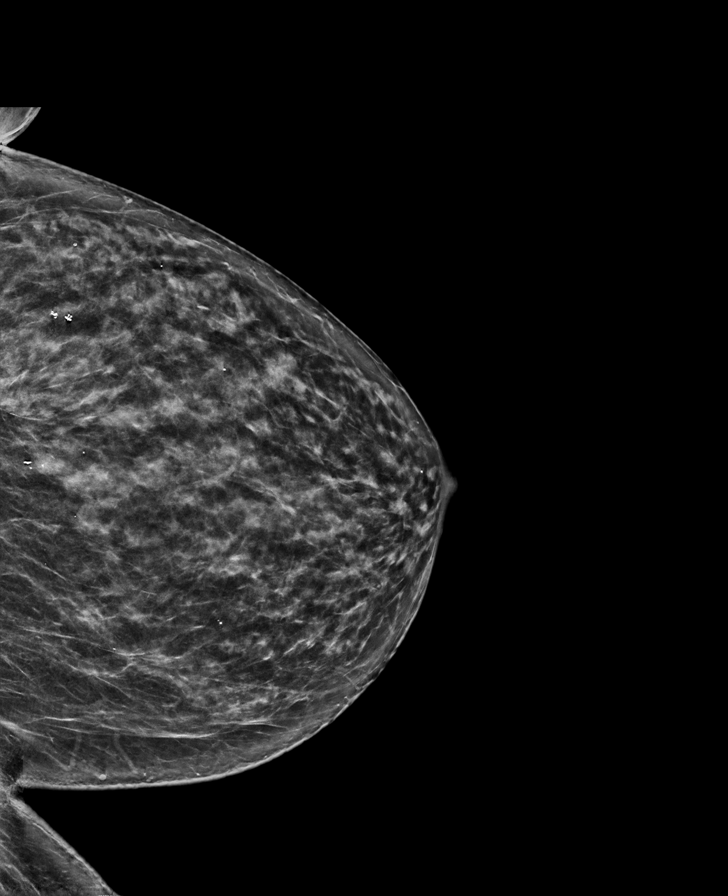

[R CC synth-2D]
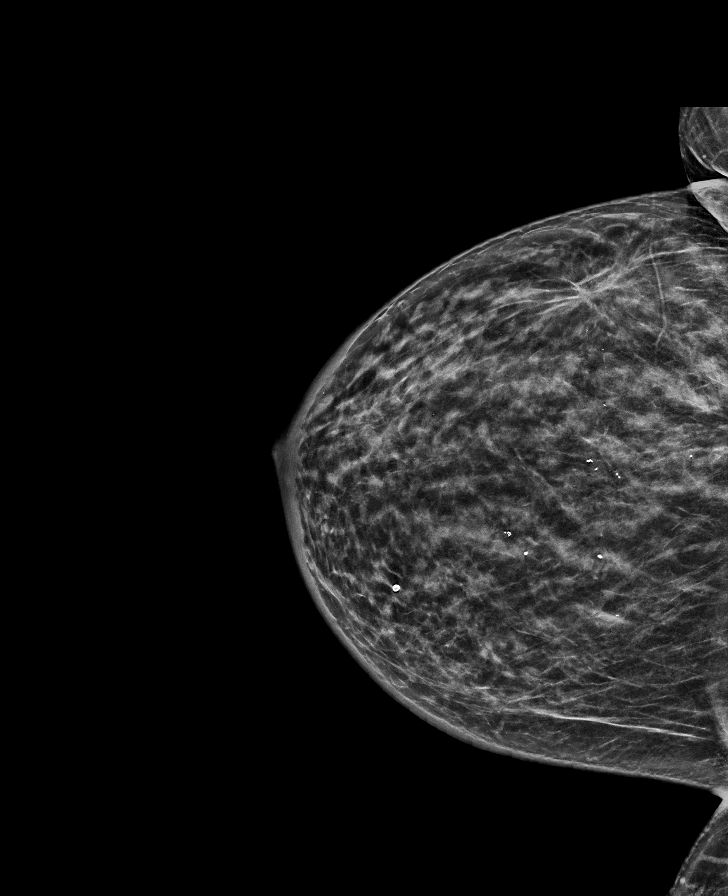

[R MLO tomo · tomo slice 32/63.0]
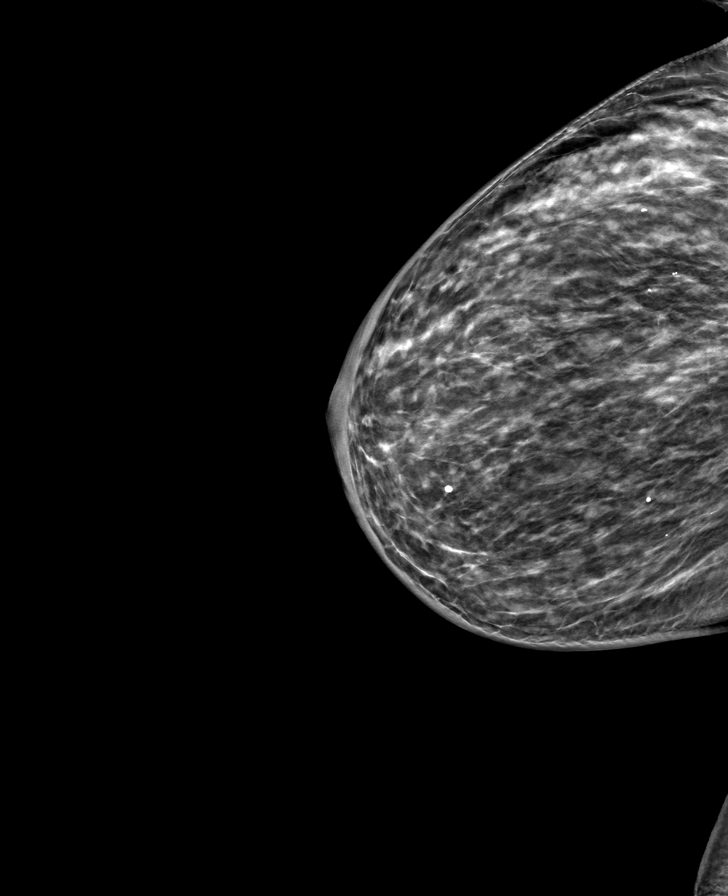

[8 of 37 positions shown; findings below may reference images not displayed]

ACR Breast Density Category c: The breast tissue is heterogeneously
dense, which may obscure small masses.
FINDINGS: Post operative changes are seen in the RIGHTbreast. No suspicious
mass, distortion, or microcalcifications are identified to suggest
presence of malignancy.
IMPRESSION: No mammographic evidence for malignancy.

RECOMMENDATION:
Diagnostic mammogram is suggested in 1 year. (Code:[id number]-J-86R)

I have discussed the findings and recommendations with the patient.
If applicable, a reminder letter will be sent to the patient
regarding the next appointment.

BI-RADS CATEGORY  2: Benign.

## 2022-10-30 ENCOUNTER — Encounter: Payer: Self-pay | Admitting: Physician Assistant

## 2022-10-30 ENCOUNTER — Ambulatory Visit: Payer: PPO | Admitting: Physician Assistant

## 2022-10-30 VITALS — BP 154/72 | HR 79 | Temp 98.0°F | Ht 64.5 in | Wt 179.4 lb

## 2022-10-30 DIAGNOSIS — E1142 Type 2 diabetes mellitus with diabetic polyneuropathy: Secondary | ICD-10-CM

## 2022-10-30 DIAGNOSIS — I152 Hypertension secondary to endocrine disorders: Secondary | ICD-10-CM

## 2022-10-30 DIAGNOSIS — Z794 Long term (current) use of insulin: Secondary | ICD-10-CM

## 2022-10-30 DIAGNOSIS — E1165 Type 2 diabetes mellitus with hyperglycemia: Secondary | ICD-10-CM | POA: Diagnosis not present

## 2022-10-30 DIAGNOSIS — E1159 Type 2 diabetes mellitus with other circulatory complications: Secondary | ICD-10-CM | POA: Diagnosis not present

## 2022-10-30 DIAGNOSIS — E785 Hyperlipidemia, unspecified: Secondary | ICD-10-CM

## 2022-10-30 NOTE — Assessment & Plan Note (Signed)
Uncontrolled Patient denies any major symptoms States it has been normal while at home Will check this week at home Will adjust medicine if continues to be elevated BP Readings from Last 3 Encounters:  10/30/22 (!) 154/72  07/24/22 (!) 154/68  07/18/22 (!) 174/76

## 2022-10-30 NOTE — Assessment & Plan Note (Signed)
Controlled Continue current treatment with Gabapentin 100mg  Will adjust as needed

## 2022-10-30 NOTE — Progress Notes (Signed)
Subjective:  Patient ID: Heather Waller, female    DOB: Jan 17, 1951  Age: 72 y.o. MRN: [id number]  Chief Complaint  Patient presents with   Medical Management of Chronic Issues    HPI   Diabetes:  Complications: Getting the medicine Glucose checking: three times daily Glucose logs: 135-250  Hypoglycemia: none  Most recent A1C: 8.2 Current medications:  Novolog 14 units three times daily, Tresiba 48 units daily.  Insulin degludec 48 units in the afternoon. Patient tried getting the Ozempic, but the pharmacy told the patient that the medication needed prior auth and the patient had already been approved via prior authorization according to our records. We will print off the approval form and send it with her to the pharmacy.  Last Eye Exam: October 2023  Foot checks: daily   Hypertension: Complications: Continues to be elevated in office. Will check BP for 1 week and let us know if it is still elevated. Current medications: Valsartan -HCT 80-12.5 daily.   Hyperlipidemia: Patient is watching her diet.      10/30/2022    8:40 AM 07/24/2022    8:15 AM 03/21/2022   11:12 AM  Depression screen PHQ 2/9  Decreased Interest 0 0 0  Down, Depressed, Hopeless 0 0 0  PHQ - 2 Score 0 0 0  Altered sleeping 2 0   Tired, decreased energy 1 0   Change in appetite 0 0   Feeling bad or failure about yourself  0 0   Trouble concentrating 0 0   Moving slowly or fidgety/restless 0 0   Suicidal thoughts 0 0   PHQ-9 Score 3 0   Difficult doing work/chores Not difficult at all Not difficult at all         10/30/2022    8:40 AM  Fall Risk   Falls in the past year? 0  Number falls in past yr: 0  Injury with Fall? 0  Risk for fall due to : No Fall Risks  Follow up Falls evaluation completed    Patient Care Team: Langley Gauss, [place] as PCP - General (Physician Assistant) Weston Settle, MD as Consulting Physician (Oncology) Darleene Cleaver, MD as Consulting Physician (General  Surgery)   Review of Systems  Constitutional:  Negative for appetite change, fatigue and fever.  HENT:  Negative for congestion, ear pain, sinus pressure and sore throat.   Respiratory:  Negative for cough, chest tightness, shortness of breath and wheezing.   Cardiovascular:  Negative for chest pain and palpitations.  Gastrointestinal:  Negative for abdominal pain, constipation, diarrhea, nausea and vomiting.  Genitourinary:  Negative for dysuria and hematuria.  Musculoskeletal:  Negative for arthralgias, back pain, joint swelling and myalgias.  Skin:  Negative for rash.  Neurological:  Negative for dizziness, weakness and headaches.  Psychiatric/Behavioral:  Negative for dysphoric mood. The patient is not nervous/anxious.     Current Outpatient Medications on File Prior to Visit  Medication Sig Dispense Refill   anastrozole (ARIMIDEX) 1 MG tablet Take 1 tablet (1 mg total) by mouth daily. 90 tablet 3   gabapentin (NEURONTIN) 100 MG capsule Take 1 capsule (100 mg total) by mouth 2 (two) times daily. 90 capsule 3   insulin aspart (NOVOLOG FLEXPEN) 100 UNIT/ML FlexPen Inject 14 Units into the skin with breakfast, with lunch, and with evening meal. Max daily 60 units 60 mL 3   Insulin Degludec FlexTouch 200 UNIT/ML SOPN 48 Units by Other route daily in the afternoon. 45 mL 4  Insulin Pen Needle 32G X 4 MM MISC 1 Device by Does not apply route in the morning, at noon, in the evening, and at bedtime. 400 each 3   valsartan-hydrochlorothiazide (DIOVAN-HCT) 80-12.5 MG tablet Take 1 tablet by mouth daily. 90 tablet 3   Semaglutide,0.25 or 0.5MG /DOS, (OZEMPIC, 0.25 OR 0.5 MG/DOSE,) 2 MG/3ML SOPN Inject 0.25 mg into the skin once a week. (Patient not taking: Reported on 10/30/2022) 3 mL 2   No current facility-administered medications on file prior to visit.   Past Medical History:  Diagnosis Date   Breast cancer (HCC)    Diabetes mellitus without complication (HCC)    Hyperlipidemia     Hypertension    Personal history of chemotherapy    Personal history of radiation therapy    Type 2 diabetes mellitus with hyperglycemia (HCC) 02/23/2020   Type 2 diabetes mellitus without complication, with long-term current use of insulin (HCC) 12/29/2020   Past Surgical History:  Procedure Laterality Date   BREAST LUMPECTOMY Right 07/2020    Family History  Problem Relation Age of Onset   Alzheimer's disease Mother    Hypertension Father    Social History   Socioeconomic History   Marital status: Married    Spouse name: Not on file   Number of children: Not on file   Years of education: Not on file   Highest education level: Not on file  Occupational History   Not on file  Tobacco Use   Smoking status: Never   Smokeless tobacco: Never  Substance and Sexual Activity   Alcohol use: Not Currently   Drug use: Not on file   Sexual activity: Not on file  Other Topics Concern   Not on file  Social History Narrative   Not on file   Social Determinants of Health   Financial Resource Strain: Not on file  Food Insecurity: Not on file  Transportation Needs: Not on file  Physical Activity: Not on file  Stress: Not on file  Social Connections: Not on file    Objective:  BP (!) 154/72 (BP Location: Left Arm, Patient Position: Sitting)   Pulse 79   Temp 98 F (36.7 C) (Oral)   Ht 5' 4.5" (1.638 m)   Wt 179 lb 6.4 oz (81.4 kg)   SpO2 96%   BMI 30.32 kg/m      10/30/2022    9:22 AM 10/30/2022    8:36 AM 07/24/2022    8:05 AM  BP/Weight  Systolic BP 154 158 154  Diastolic BP 72 68 68  Wt. (Lbs)  179.4 178  BMI  30.32 kg/m2 30.08 kg/m2    Physical Exam Vitals reviewed.  Constitutional:      Appearance: Normal appearance.  Cardiovascular:     Rate and Rhythm: Normal rate and regular rhythm.     Heart sounds: Normal heart sounds.  Pulmonary:     Effort: Pulmonary effort is normal.     Breath sounds: Normal breath sounds.  Abdominal:     General: Bowel sounds  are normal.     Palpations: Abdomen is soft.     Tenderness: There is no abdominal tenderness.  Neurological:     Mental Status: She is alert and oriented to person, place, and time.  Psychiatric:        Mood and Affect: Mood normal.        Behavior: Behavior normal.     Diabetic Foot Exam - Simple   No data filed  Lab Results  Component Value Date   WBC 8.9 07/24/2022   HGB 15.1 07/24/2022   HCT 45.1 07/24/2022   PLT 258 07/24/2022   GLUCOSE 223 (H) 07/24/2022   CHOL 177 07/24/2022   TRIG 137 07/24/2022   HDL 58 07/24/2022   LDLCALC 95 07/24/2022   ALT 15 07/24/2022   AST 19 07/24/2022   NA 138 07/24/2022   K 5.0 07/24/2022   CL 97 07/24/2022   CREATININE 0.68 07/24/2022   BUN 9 07/24/2022   CO2 26 07/24/2022   TSH 0.910 03/21/2022   HGBA1C 8.2 (H) 07/24/2022      Assessment & Plan:    Hypertension associated with diabetes [facility]) Assessment & Plan: Uncontrolled Patient denies any major symptoms States it has been normal while at home Will check this week at home Will adjust medicine if continues to be elevated BP Readings from Last 3 Encounters:  10/30/22 (!) 154/72  07/24/22 (!) 154/68  07/18/22 (!) 174/76     Orders: -     CBC with Differential/Platelet -     Comprehensive metabolic panel  Type 2 diabetes mellitus with hyperglycemia, with long-term current use of insulin (HCC) Assessment & Plan: Has not been able to get Ozempic from her pharmacy Printed the approval form from her insurance for her to take to the pharmacy Will send to a different pharmacy if she continues to have issues Lab Results  Component Value Date   HGBA1C 8.2 (H) 07/24/2022   HGBA1C 9.2 (H) 03/21/2022   HGBA1C 7.5 (A) 11/02/2021     Orders: -     Hemoglobin A1c  Diabetic polyneuropathy associated with type 2 diabetes mellitus (HCC) Assessment & Plan: Controlled Continue current treatment with Gabapentin 100mg  Will adjust as  needed   Dyslipidemia Assessment & Plan: Controlled Continue monitoring diet and exercise to help control cholesterol Labs drawn today Will adjust as needed Lab Results  Component Value Date   LDLCALC 95 07/24/2022     Orders: -     Lipid panel     No orders of the defined types were placed in this encounter.   Orders Placed This Encounter  Procedures   CBC with Differential/Platelet   Comprehensive metabolic panel   Hemoglobin A1c   Lipid panel     Follow-up: Return in about 3 months (around 01/29/2023).   I,Marla I Leal-Borjas,acting as a scribe for [employer], PA.,have documented all relevant documentation on the behalf of Langley Gauss, PA,as directed by  Langley Gauss, PA while in the presence of Langley Gauss, [place].   An After Visit Summary was printed and given to the patient.  Langley Gauss, [place] [facility] [phone]

## 2022-10-30 NOTE — Assessment & Plan Note (Signed)
Has not been able to get Ozempic from her pharmacy Printed the approval form from her insurance for her to take to the pharmacy Will send to a different pharmacy if she continues to have issues Lab Results  Component Value Date   HGBA1C 8.2 (H) 07/24/2022   HGBA1C 9.2 (H) 03/21/2022   HGBA1C 7.5 (A) 11/02/2021

## 2022-10-30 NOTE — Assessment & Plan Note (Signed)
Controlled Continue monitoring diet and exercise to help control cholesterol Labs drawn today Will adjust as needed Lab Results  Component Value Date   LDLCALC 95 07/24/2022

## 2022-10-31 LAB — COMPREHENSIVE METABOLIC PANEL
ALT: 16 IU/L (ref 0–32)
AST: 13 IU/L (ref 0–40)
Albumin: 4.5 g/dL (ref 3.8–4.8)
Alkaline Phosphatase: 77 IU/L (ref 44–121)
BUN/Creatinine Ratio: 17 (ref 12–28)
BUN: 11 mg/dL (ref 8–27)
Bilirubin Total: 0.4 mg/dL (ref 0.0–1.2)
CO2: 23 mmol/L (ref 20–29)
Calcium: 9.6 mg/dL (ref 8.7–10.3)
Chloride: 97 mmol/L (ref 96–106)
Creatinine, Ser: 0.63 mg/dL (ref 0.57–1.00)
Globulin, Total: 2.8 g/dL (ref 1.5–4.5)
Glucose: 216 mg/dL — ABNORMAL HIGH (ref 70–99)
Potassium: 4.4 mmol/L (ref 3.5–5.2)
Sodium: 137 mmol/L (ref 134–144)
Total Protein: 7.3 g/dL (ref 6.0–8.5)
eGFR: 93 mL/min/{1.73_m2} (ref >59)

## 2022-10-31 LAB — LIPID PANEL
Chol/HDL Ratio: 3.4 ratio (ref 0.0–4.4)
Cholesterol, Total: 176 mg/dL (ref 100–199)
HDL: 52 mg/dL (ref >39)
LDL Chol Calc (NIH): 98 mg/dL (ref 0–99)
Triglycerides: 146 mg/dL (ref 0–149)
VLDL Cholesterol Cal: 26 mg/dL (ref 5–40)

## 2022-10-31 LAB — CBC WITH DIFFERENTIAL/PLATELET
Basophils Absolute: 0.1 10*3/uL (ref 0.0–0.2)
Basos: 1 %
EOS (ABSOLUTE): 0.1 10*3/uL (ref 0.0–0.4)
Eos: 1 %
Hematocrit: 45.5 % (ref 34.0–46.6)
Hemoglobin: 15.1 g/dL (ref 11.1–15.9)
Immature Grans (Abs): 0 10*3/uL (ref 0.0–0.1)
Immature Granulocytes: 0 %
Lymphocytes Absolute: 2.2 10*3/uL (ref 0.7–3.1)
Lymphs: 28 %
MCH: 30.4 pg (ref 26.6–33.0)
MCHC: 33.2 g/dL (ref 31.5–35.7)
MCV: 92 fL (ref 79–97)
Monocytes Absolute: 0.4 10*3/uL (ref 0.1–0.9)
Monocytes: 6 %
Neutrophils Absolute: 4.9 10*3/uL (ref 1.4–7.0)
Neutrophils: 64 %
Platelets: 242 10*3/uL (ref 150–450)
RBC: 4.97 x10E6/uL (ref 3.77–5.28)
RDW: 12.6 % (ref 11.7–15.4)
WBC: 7.7 10*3/uL (ref 3.4–10.8)

## 2022-10-31 LAB — HEMOGLOBIN A1C
Est. average glucose Bld gHb Est-mCnc: 209 mg/dL
Hgb A1c MFr Bld: 8.9 % — ABNORMAL HIGH (ref 4.8–5.6)

## 2022-11-07 ENCOUNTER — Encounter: Payer: Self-pay | Admitting: Internal Medicine

## 2022-11-07 ENCOUNTER — Ambulatory Visit: Payer: PPO | Admitting: Internal Medicine

## 2022-11-07 ENCOUNTER — Telehealth: Payer: Self-pay | Admitting: Internal Medicine

## 2022-11-07 ENCOUNTER — Other Ambulatory Visit (HOSPITAL_COMMUNITY): Payer: Self-pay

## 2022-11-07 VITALS — BP 124/78 | HR 88 | Ht 64.5 in | Wt 180.0 lb

## 2022-11-07 DIAGNOSIS — E1165 Type 2 diabetes mellitus with hyperglycemia: Secondary | ICD-10-CM

## 2022-11-07 DIAGNOSIS — E1142 Type 2 diabetes mellitus with diabetic polyneuropathy: Secondary | ICD-10-CM

## 2022-11-07 DIAGNOSIS — Z794 Long term (current) use of insulin: Secondary | ICD-10-CM

## 2022-11-07 MED ORDER — NOVOLOG FLEXPEN 100 UNIT/ML ~~LOC~~ SOPN: 14.0000 [IU] | PEN_INJECTOR | Freq: Three times a day (TID) | SUBCUTANEOUS | 3 refills | Status: DC

## 2022-11-07 MED ORDER — TIRZEPATIDE 5 MG/0.5ML ~~LOC~~ SOAJ: 5.0000 mg | SUBCUTANEOUS | 3 refills | Status: DC

## 2022-11-07 MED ORDER — INSULIN DEGLUDEC FLEXTOUCH 200 UNIT/ML ~~LOC~~ SOPN: 50.0000 [IU] | PEN_INJECTOR | Freq: Every day | SUBCUTANEOUS | 5 refills | Status: DC

## 2022-11-07 MED ORDER — INSULIN PEN NEEDLE 32G X 4 MM MISC: 1.0000 | Freq: Four times a day (QID) | 3 refills | Status: DC

## 2022-11-07 NOTE — Patient Instructions (Addendum)
Restart Mounjaro 5 mg once weekly  Increase  Tresiba  50  units daily  Continue Novolog 14 units with each meal       HOW TO TREAT LOW BLOOD SUGARS (Blood sugar LESS THAN 70 MG/DL) Please follow the RULE OF 15 for the treatment of hypoglycemia treatment (when your (blood sugars are less than 70 mg/dL)   STEP 1: Take 15 grams of carbohydrates when your blood sugar is low, which includes:  3-4 GLUCOSE TABS  OR 3-4 OZ OF JUICE OR REGULAR SODA OR ONE TUBE OF GLUCOSE GEL    STEP 2: RECHECK blood sugar in 15 MINUTES STEP 3: If your blood sugar is still low at the 15 minute recheck --> then, go back to STEP 1 and treat AGAIN with another 15 grams of carbohydrate

## 2022-11-07 NOTE — Telephone Encounter (Signed)
Can you please obtain prior authorization for [facility] on this patient?   Her PCPs office just got authorization for the Ozempic but she does have constipation due to Ozempic and has done better on [employer]

## 2022-11-07 NOTE — Progress Notes (Signed)
Name: Heather Waller  Age/ Sex: 72 y.o., female   MRN/ DOB: [id number], March 30, 1950     PCP: Langley Gauss, PA   Reason for Endocrinology Evaluation: Type 2 Diabetes Mellitus  Initial Endocrine Consultative Visit: 02/23/2020    PATIENT IDENTIFIER: Heather Waller is a 72 y.o. female with a past medical history of T2DM, HTN , Hx breast CA (status postlumpectomy 07/2020 )and dyslipidemia. The patient has followed with Endocrinology clinic since 02/23/2020 for consultative assistance with management of her diabetes.     DIABETIC HISTORY:  Heather Waller was diagnosed with DM yrs ago,Intolerant to Metformin due to diarrhea .  Her hemoglobin A1c has ranged from 10.7% in 2021, peaking at 10.8%  in 2022.    On her initial visit to our clinic she had an A1c of 10.8%, she was on [place] which we continued and added prandial insulin.  She was started on Farxiga by her PCP 03/2022 with an A1c 9.2% but it was out of stock, so she was started on Mounjaro    SUBJECTIVE:   During the last visit (05/03/2022): A1c 9.2 %      Today (11/07/2022): Heather Waller is here for a follow up on diabetes management.  She checks her blood sugars 3  times daily. The patient has not had hypoglycemic episodes since the last clinic visit.   She continues to follow-up with oncology for history of breast cancer  On her last visit with me she was on [facility], but this had to be changed back to Ozempic through her PCPs office due to shortage of supply in June 2024   She has had constipation that she attributed to Ozempic use  Denies nausea or vomiting  Has occasional abdominal pain    HOME DIABETES REGIMEN:  Mounjaro 5 mg weekly Tresiba 48 units daily NovoLog 14 units      Statin: Declines (02/2020) ACE-I/ARB: Yes    Unable to Download: unable to download 161-323 mg/dL     DIABETIC COMPLICATIONS: Microvascular complications:  Neuropathy- chemo related  Denies: CKD,  retinopathy Last Eye Exam: Completed 11/2021  Macrovascular complications:   Denies: CAD, CVA, PVD   HISTORY:  Past Medical History:  Past Medical History:  Diagnosis Date   Breast cancer (HCC)    Diabetes mellitus without complication (HCC)    Hyperlipidemia    Hypertension    Personal history of chemotherapy    Personal history of radiation therapy    Type 2 diabetes mellitus with hyperglycemia (HCC) 02/23/2020   Type 2 diabetes mellitus without complication, with long-term current use of insulin (HCC) 12/29/2020   Past Surgical History:  Past Surgical History:  Procedure Laterality Date   BREAST LUMPECTOMY Right 07/2020   Social History:  reports that she has never smoked. She has never used smokeless tobacco. She reports that she does not currently use alcohol. No history on file for drug use. Family History:  Family History  Problem Relation Age of Onset   Alzheimer's disease Mother    Hypertension Father      HOME MEDICATIONS: Allergies as of 11/07/2022       Reactions   Latex Itching   Codeine    Iodine    Oxycodone         Medication List        Accurate as of November 07, 2022 10:24 AM. If you have any questions, ask your nurse or doctor.          albuterol 108 (  90 Base) MCG/ACT inhaler Commonly known as: VENTOLIN HFA Inhale 2 puffs into the lungs every 4 (four) hours as needed.   anastrozole 1 MG tablet Commonly known as: ARIMIDEX Take 1 tablet (1 mg total) by mouth daily.   gabapentin 100 MG capsule Commonly known as: NEURONTIN Take 1 capsule (100 mg total) by mouth 2 (two) times daily.   Insulin Degludec FlexTouch 200 UNIT/ML Sopn 48 Units by Other route daily in the afternoon.   Insulin Pen Needle 32G X 4 MM Misc 1 Device by Does not apply route in the morning, at noon, in the evening, and at bedtime.   NovoLOG FlexPen 100 UNIT/ML FlexPen Generic drug: insulin aspart Inject 14 Units into the skin with breakfast, with lunch, and  with evening meal. Max daily 60 units   Ozempic (0.25 or 0.5 MG/DOSE) 2 MG/3ML Sopn Generic drug: Semaglutide(0.25 or 0.5MG /DOS) Inject 0.25 mg into the skin once a week.   valsartan-hydrochlorothiazide 80-12.5 MG tablet Commonly known as: DIOVAN-HCT Take 1 tablet by mouth daily.         OBJECTIVE:   Vital Signs: BP 124/78 (BP Location: Left Arm, Patient Position: Sitting, Cuff Size: Large)   Pulse 88   Ht 5' 4.5" (1.638 m)   Wt 180 lb (81.6 kg)   SpO2 99%   BMI 30.42 kg/m   Wt Readings from Last 3 Encounters:  11/07/22 180 lb (81.6 kg)  10/30/22 179 lb 6.4 oz (81.4 kg)  07/24/22 178 lb (80.7 kg)     Exam: General: Pt appears well and is in NAD  Lungs: Clear with good BS bilat   Heart: RRR   Extremities: No   pretibial edema.   Neuro: MS is good with appropriate affect, pt is alert and Ox3    DM foot exam:05/03/2022     The skin of the feet is intact without sores or ulcerations. The pedal pulses are 2+ on right and 2+ on left. The sensation is intact to a screening 5.07, 10 gram monofilament bilaterally    DATA REVIEWED:  Lab Results  Component Value Date   HGBA1C 8.9 (H) 10/30/2022   HGBA1C 8.2 (H) 07/24/2022   HGBA1C 9.2 (H) 03/21/2022    Latest Reference Range & Units 10/30/22 09:52  Sodium 134 - 144 mmol/L 137  Potassium 3.5 - 5.2 mmol/L 4.4  Chloride 96 - 106 mmol/L 97  CO2 20 - 29 mmol/L 23  Glucose 70 - 99 mg/dL 295 (H)  BUN 8 - 27 mg/dL 11  Creatinine 1.88 - 4.16 mg/dL 6.06  Calcium 8.7 - 30.1 mg/dL 9.6  BUN/Creatinine Ratio 12 - 28  17  eGFR >59 mL/min/1.73 93  Alkaline Phosphatase 44 - 121 IU/L 77  Albumin 3.8 - 4.8 g/dL 4.5  AST 0 - 40 IU/L 13  ALT 0 - 32 IU/L 16  Total Protein 6.0 - 8.5 g/dL 7.3  Total Bilirubin 0.0 - 1.2 mg/dL 0.4  Total CHOL/HDL Ratio 0.0 - 4.4 ratio 3.4  Cholesterol, Total 100 - 199 mg/dL 601  HDL Cholesterol >09 mg/dL 52  Triglycerides 0 - 323 mg/dL 557  VLDL Cholesterol Cal 5 - 40 mg/dL 26  LDL Chol Calc  (NIH) 0 - 99 mg/dL 98  (H): Data is abnormally high    Old records , labs and images have been reviewed.   ASSESSMENT / PLAN / RECOMMENDATIONS:   1) Type 2 Diabetes Mellitus, Poorly  controlled, With neuropathic  complications - Most recent A1c of  8.9%. Goal A1c <  7.0 %.     -A1c has trended up -She has been without a GLP-1 agonist for months -Patient reported constipation with Ozempic, will prefer to go back on Mounjaro, she did well and Mounjaro but there was a shortage of supply -I will also increase her basal insulin as below - She has issues with learning and maneuvering CGM in the past and opted with finger sticks  -Patient advised to notify our office with any hypoglycemic episodes -Labs reviewed through PCPs office  MEDICATIONS: Restart Mounjaro 5 mg weekly  Increase Tresiba 50 units daily Continue  NovoLog 14 units with each meal   EDUCATION / INSTRUCTIONS: BG monitoring instructions: Patient is instructed to check her blood sugars 3 times a day, before meals . Call [place] [facility] clinic if: BG persistently < 70  I reviewed the Rule of 15 for the treatment of hypoglycemia in detail with the patient. Literature supplied.   2) Diabetic complications:  Eye: Does not have known diabetic retinopathy.  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  Renal: Patient does not have known baseline CKD. She   is  on an ACEI/ARB at present.    F/U in 4 months     Signed electronically by: Lyndle Herrlich, MD  [facility]  [facility] [address] [place] [place], [place] [address] Phone: [phone] FAX: [phone]   CC: Langley Gauss, [place] [address] [place] [address] Phone: [phone]  Fax: [phone]  Return to Endocrinology clinic as below: Future Appointments  Date Time Provider Department Center  11/07/2022 10:30 AM Nicholas Trompeter, Konrad Dolores, MD [facility] None  01/17/2023 10:30  AM Weston Settle, MD [facility] None  02/07/2023  9:20 AM Langley Gauss, PA [facility] None  06/17/2023 10:20 AM [facility] 1 [facility]

## 2022-11-19 ENCOUNTER — Other Ambulatory Visit: Payer: Self-pay | Admitting: Internal Medicine

## 2022-11-25 LAB — HM DIABETES EYE EXAM

## 2022-11-27 ENCOUNTER — Other Ambulatory Visit: Payer: Self-pay | Admitting: Oncology

## 2022-11-27 ENCOUNTER — Other Ambulatory Visit: Payer: Self-pay | Admitting: Internal Medicine

## 2022-11-27 ENCOUNTER — Other Ambulatory Visit: Payer: Self-pay

## 2022-11-27 MED ORDER — INSULIN DEGLUDEC FLEXTOUCH 200 UNIT/ML ~~LOC~~ SOPN: 50.0000 [IU] | PEN_INJECTOR | Freq: Every day | SUBCUTANEOUS | 5 refills | Status: DC

## 2022-11-27 MED ORDER — TIRZEPATIDE 5 MG/0.5ML ~~LOC~~ SOAJ: 5.0000 mg | SUBCUTANEOUS | 3 refills | Status: DC

## 2022-12-02 ENCOUNTER — Other Ambulatory Visit: Payer: Self-pay

## 2023-01-06 ENCOUNTER — Other Ambulatory Visit: Payer: Self-pay

## 2023-01-06 ENCOUNTER — Encounter: Payer: Self-pay | Admitting: Internal Medicine

## 2023-01-06 MED ORDER — INSULIN DEGLUDEC FLEXTOUCH 200 UNIT/ML ~~LOC~~ SOPN: 50.0000 [IU] | PEN_INJECTOR | Freq: Every day | SUBCUTANEOUS | 5 refills | Status: DC

## 2023-01-06 NOTE — Progress Notes (Unsigned)
[facility]  [address] [place],  [place]  [address] [phone]  Clinic Day:  01/07/2023  Referring physician: Langley Gauss, PA  HISTORY OF PRESENT ILLNESS:  The patient is a 72 y.o. female with stage IA (T1c N0 M0) hormone/Her2 positive right breast cancer, status post a lumpectomy in June 2022.  She completed 6 cycles of adjuvant TCH chemotherapy in October 2022. She completed her maintenance Herceptin in June 2023.  She remains on anastrozole for her adjuvant endocrine therapy.  She comes in today for routine follow-up.  Since her last visit, the patient has been doing well.  She denies having any particular changes in her breasts which concern her for disease recurrence.   VITALS:  Blood pressure (!) 169/69, pulse 90, temperature 97.7 F (36.5 C), resp. rate 14, height 5' 4.5" (1.638 m), weight 178 lb 6.4 oz (80.9 kg), SpO2 97%.  Wt Readings from Last 3 Encounters:  01/07/23 178 lb 6.4 oz (80.9 kg)  11/07/22 180 lb (81.6 kg)  10/30/22 179 lb 6.4 oz (81.4 kg)    Body mass index is 30.15 kg/m.  Performance status (ECOG): 1  PHYSICAL EXAM:  Physical Exam Constitutional:      General: She is not in acute distress.    Appearance: Normal appearance. She is normal weight.  HENT:     Head: Normocephalic and atraumatic.     Mouth/Throat:     Pharynx: Oropharynx is clear. No oropharyngeal exudate.  Eyes:     General: No scleral icterus.    Extraocular Movements: Extraocular movements intact.     Conjunctiva/sclera: Conjunctivae normal.     Pupils: Pupils are equal, round, and reactive to light.  Cardiovascular:     Rate and Rhythm: Normal rate and regular rhythm.     Pulses: Normal pulses.     Heart sounds: Normal heart sounds. No murmur heard.    No friction rub. No gallop.  Pulmonary:     Effort: Pulmonary effort is normal. No respiratory distress.     Breath sounds: Normal breath sounds.  Chest:  Breasts:    Right: No swelling,  bleeding, inverted nipple, mass, nipple discharge or skin change.     Left: No swelling, bleeding, inverted nipple, mass, nipple discharge or skin change.  Abdominal:     General: Bowel sounds are normal. There is no distension.     Palpations: Abdomen is soft. There is no hepatomegaly, splenomegaly or mass.     Tenderness: There is no abdominal tenderness.  Musculoskeletal:        General: No tenderness. Normal range of motion.     Cervical back: Normal range of motion and neck supple.     Right lower leg: No edema.     Left lower leg: No edema.  Lymphadenopathy:     Cervical: No cervical adenopathy.     Right cervical: No superficial, deep or posterior cervical adenopathy.    Left cervical: No superficial, deep or posterior cervical adenopathy.     Upper Body:     Right upper body: No supraclavicular or axillary adenopathy.     Left upper body: No supraclavicular or axillary adenopathy.     Lower Body: No right inguinal adenopathy. No left inguinal adenopathy.  Skin:    General: Skin is warm and dry.     Coloration: Skin is not jaundiced.     Findings: No lesion or rash.  Neurological:     General: No focal deficit present.  Mental Status: She is alert and oriented to person, place, and time. Mental status is at baseline.  Psychiatric:        Mood and Affect: Mood normal.        Behavior: Behavior normal.        Thought Content: Thought content normal.        Judgment: Judgment normal.    ASSESSMENT & PLAN:  Assessment/Plan:  A  72 y.o. female with stage IA (T1c N0 M0) hormone/Her2 positive breast cancer, status post a lumpectomy in June 2022, followed by [facility] x 6 and maintenance Herceptin x 1 year.  Based upon her clinical breast exam today, the patient remains disease-free.  She knows to continue taking her anastrozole daily x 5 years for her adjuvant endocrine therapy.  Overall, she appears to be doing very well.  I we will see her back in 6 months for repeat clinical  assessment.  Her annual mammogram will be done before her next visit for her continued radiographic breast cancer surveillance.  The patient understands all the plans discussed today and is in agreement with them.    Babak Lucus Kirby Funk, MD

## 2023-01-07 ENCOUNTER — Telehealth: Payer: Self-pay | Admitting: Oncology

## 2023-01-07 ENCOUNTER — Inpatient Hospital Stay: Payer: PPO | Attending: Oncology | Admitting: Oncology

## 2023-01-07 VITALS — BP 169/69 | HR 90 | Temp 97.7°F | Resp 14 | Ht 64.5 in | Wt 178.4 lb

## 2023-01-07 DIAGNOSIS — Z79811 Long term (current) use of aromatase inhibitors: Secondary | ICD-10-CM | POA: Insufficient documentation

## 2023-01-07 DIAGNOSIS — C50911 Malignant neoplasm of unspecified site of right female breast: Secondary | ICD-10-CM | POA: Insufficient documentation

## 2023-01-07 DIAGNOSIS — Z170 Estrogen receptor positive status [ER+]: Secondary | ICD-10-CM | POA: Insufficient documentation

## 2023-01-07 DIAGNOSIS — C50411 Malignant neoplasm of upper-outer quadrant of right female breast: Secondary | ICD-10-CM

## 2023-01-07 NOTE — Telephone Encounter (Signed)
Patient has been scheduled for follow-up visit per 01/07/23 LOS.  Pt given an appt calendar with date and time.

## 2023-01-17 ENCOUNTER — Inpatient Hospital Stay: Payer: PPO | Admitting: Oncology

## 2023-02-06 NOTE — Progress Notes (Unsigned)
Subjective:  Patient ID: Heather Waller, female    DOB: 07-Feb-1951  Age: 72 y.o. MRN: [id number]  Chief Complaint  Patient presents with   Medical Management of Chronic Issues    HPI   Diabetes:  Complications: Getting the medicine Glucose checking: three times daily Glucose logs: 130-160  Hypoglycemia: none  Most recent A1C: 8.9% Current medications:  Novolog 14 units three times daily, Insulin degludec 50 units in the afternoon. Mounjaro 5 mg once a week. Last Eye Exam: October 2024  Foot checks: daily  See endocrinologist  Hypertension: Current medications: Valsartan -HCT 80-12.5 daily.   Hyperlipidemia: Patient is watching her diet.  Discussed the use of AI scribe software for clinical note transcription with the patient, who gave verbal consent to proceed.  History of Present Illness   A 72 year old patient with a history of chemotherapy treatment and neuropathy in her feet presents for a chronic follow-up. She reports elevated blood pressure readings, which she attributes to stress and possibly disrupted sleep patterns. She mentions waking up at 3 am and not being able to return to sleep. She also notes that she has been taking Tylenol to manage the neuropathy in her feet. The patient has been on valsartan for blood pressure management and reports no issues with this medication. She also mentions a recent bereavement in the family, which may be contributing to her stress levels. The patient has a history of chemotherapy treatment, which she notes has affected her taste preferences, leading her to prefer warm foods over cold ones. She also mentions frequent dining out due to family circumstances.          10/30/2022    8:40 AM 07/24/2022    8:15 AM 03/21/2022   11:12 AM  Depression screen PHQ 2/9  Decreased Interest 0 0 0  Down, Depressed, Hopeless 0 0 0  PHQ - 2 Score 0 0 0  Altered sleeping 2 0   Tired, decreased energy 1 0   Change in appetite 0 0   Feeling  bad or failure about yourself  0 0   Trouble concentrating 0 0   Moving slowly or fidgety/restless 0 0   Suicidal thoughts 0 0   PHQ-9 Score 3 0   Difficult doing work/chores Not difficult at all Not difficult at all         10/30/2022    8:40 AM  Fall Risk   Falls in the past year? 0  Number falls in past yr: 0  Injury with Fall? 0  Risk for fall due to : No Fall Risks  Follow up Falls evaluation completed    Patient Care Team: Langley Gauss, [place] as PCP - General (Physician Assistant) Weston Settle, MD as Consulting Physician (Oncology) Darleene Cleaver, MD as Consulting Physician (General Surgery)   Review of Systems  Constitutional:  Negative for chills, fatigue and fever.  HENT:  Negative for congestion, ear pain and sore throat.   Respiratory:  Negative for cough and shortness of breath.   Cardiovascular:  Negative for chest pain and palpitations.  Gastrointestinal:  Negative for abdominal pain, constipation, diarrhea, nausea and vomiting.  Endocrine: Negative for polydipsia, polyphagia and polyuria.  Genitourinary:  Negative for difficulty urinating and dysuria.  Musculoskeletal:  Negative for arthralgias, back pain and myalgias.  Skin:  Negative for rash.  Neurological:  Negative for headaches.  Psychiatric/Behavioral:  Negative for dysphoric mood. The patient is not nervous/anxious.     Current Outpatient Medications on File  Prior to Visit  Medication Sig Dispense Refill   anastrozole (ARIMIDEX) 1 MG tablet TAKE 1 TABLET BY MOUTH EVERY DAY 90 tablet 3   insulin aspart (NOVOLOG FLEXPEN) 100 UNIT/ML FlexPen Inject 14 Units into the skin with breakfast, with lunch, and with evening meal. Max daily 60 units 60 mL 3   Insulin Degludec FlexTouch 200 UNIT/ML SOPN 50 Units by Other route daily in the afternoon. 45 mL 5   Insulin Pen Needle 32G X 4 MM MISC 1 Device by Does not apply route in the morning, at noon, in the evening, and at bedtime. 400 each 3   tirzepatide  (MOUNJARO) 5 MG/0.5ML Pen Inject 5 mg into the skin once a week. 6 mL 3   valsartan-hydrochlorothiazide (DIOVAN-HCT) 80-12.5 MG tablet Take 1 tablet by mouth daily. 90 tablet 3   No current facility-administered medications on file prior to visit.   Past Medical History:  Diagnosis Date   Breast cancer (HCC)    Diabetes mellitus without complication (HCC)    Hyperlipidemia    Hypertension    Personal history of chemotherapy    Personal history of radiation therapy    Type 2 diabetes mellitus with hyperglycemia (HCC) 02/23/2020   Type 2 diabetes mellitus without complication, with long-term current use of insulin (HCC) 12/29/2020   Past Surgical History:  Procedure Laterality Date   BREAST LUMPECTOMY Right 07/2020    Family History  Problem Relation Age of Onset   Alzheimer's disease Mother    Hypertension Father    Social History   Socioeconomic History   Marital status: Married    Spouse name: Not on file   Number of children: Not on file   Years of education: Not on file   Highest education level: Not on file  Occupational History   Not on file  Tobacco Use   Smoking status: Never   Smokeless tobacco: Never  Vaping Use   Vaping status: Never Used  Substance and Sexual Activity   Alcohol use: Not Currently   Drug use: Never   Sexual activity: Yes    Partners: Male  Other Topics Concern   Not on file  Social History Narrative   Not on file   Social Drivers of Health   Financial Resource Strain: Not on file  Food Insecurity: Not on file  Transportation Needs: Not on file  Physical Activity: Not on file  Stress: Not on file  Social Connections: Not on file    Objective:  BP (!) 150/70   Pulse 88   Temp (!) 97.4 F (36.3 C)   Resp 14   Ht 5' 4.5" (1.638 m)   Wt 176 lb (79.8 kg)   SpO2 97%   BMI 29.74 kg/m      02/07/2023    9:19 AM 01/07/2023    2:15 PM 11/07/2022   10:19 AM  BP/Weight  Systolic BP 150 169 124  Diastolic BP 70 69 78  Wt.  (Lbs) 176 178.4 180  BMI 29.74 kg/m2 30.15 kg/m2 30.42 kg/m2    Physical Exam Vitals reviewed.  Constitutional:      Appearance: Normal appearance.  Cardiovascular:     Rate and Rhythm: Normal rate and regular rhythm.     Heart sounds: Normal heart sounds.  Pulmonary:     Effort: Pulmonary effort is normal.     Breath sounds: Normal breath sounds.  Abdominal:     General: Bowel sounds are normal.     Palpations: Abdomen is soft.  Tenderness: There is no abdominal tenderness.  Neurological:     Mental Status: She is alert and oriented to person, place, and time.  Psychiatric:        Mood and Affect: Mood normal.        Behavior: Behavior normal.     Diabetic Foot Exam - Simple   No data filed      Lab Results  Component Value Date   WBC 7.7 10/30/2022   HGB 15.1 10/30/2022   HCT 45.5 10/30/2022   PLT 242 10/30/2022   GLUCOSE 216 (H) 10/30/2022   CHOL 176 10/30/2022   TRIG 146 10/30/2022   HDL 52 10/30/2022   LDLCALC 98 10/30/2022   ALT 16 10/30/2022   AST 13 10/30/2022   NA 137 10/30/2022   K 4.4 10/30/2022   CL 97 10/30/2022   CREATININE 0.63 10/30/2022   BUN 11 10/30/2022   CO2 23 10/30/2022   TSH 0.910 03/21/2022   HGBA1C 8.9 (H) 10/30/2022      Assessment & Plan:    Hypertension associated with diabetes (HCC)  Type 2 diabetes mellitus with hyperglycemia, with long-term current use of insulin (HCC)  Dyslipidemia     No orders of the defined types were placed in this encounter.   No orders of the defined types were placed in this encounter. Sleep Disturbances Reports variable sleep patterns, waking up early, and sometimes needing to nap during the day. Recognizes the potential impact on blood pressure control. -Encouraged to continue listening to her body and prioritizing sleep for overall health.  Colon Cancer Screening Discussed the importance of colonoscopy for cancer screening and detection of other gastrointestinal conditions.  Patient hesitant and wishes to reconsider in 2025. -Respect patient's decision and revisit the topic in 2025.  Immunizations Uncertain about the date of her last tetanus vaccine. -Check state registry for immunization records.    Hypertension Elevated blood pressure readings at home and in the office. Currently on Valsartan 80mg /Hydrochlorothiazide 12.5mg . Discussed the potential need to increase Valsartan dose, but also the need to monitor for orthostatic hypotension. -Check home blood pressure readings twice daily for a week (morning and night), ensuring proper technique (sitting for at least 10 minutes, legs uncrossed). -Return for a nurse visit next week to review blood pressure readings and adjust medication as needed.  Sleep Disturbances Reports variable sleep patterns, waking up early, and sometimes needing to nap during the day. Recognizes the potential impact on blood pressure control. -Encouraged to continue listening to her body and prioritizing sleep for overall health.  Colon Cancer Screening Discussed the importance of colonoscopy for cancer screening and detection of other gastrointestinal conditions. Patient hesitant and wishes to reconsider in 2025. -Respect patient's decision and revisit the topic in 2025.  Immunizations Uncertain about the date of her last tetanus vaccine. -Check state registry for immunization records.  Follow-up in March 2025 to monitor blood pressure and cholesterol management.       Follow-up: No follow-ups on file.   I,Marla I Leal-Borjas,acting as a scribe for [employer], PA.,have documented all relevant documentation on the behalf of Langley Gauss, PA,as directed by  Langley Gauss, PA while in the presence of Langley Gauss, [place].   An After Visit Summary was printed and given to the patient.  Langley Gauss, [place] [facility] [phone]

## 2023-02-07 ENCOUNTER — Encounter: Payer: Self-pay | Admitting: Physician Assistant

## 2023-02-07 ENCOUNTER — Ambulatory Visit (INDEPENDENT_AMBULATORY_CARE_PROVIDER_SITE_OTHER): Payer: PPO | Admitting: Physician Assistant

## 2023-02-07 VITALS — BP 160/80 | HR 88 | Temp 97.4°F | Resp 14 | Ht 64.5 in | Wt 176.0 lb

## 2023-02-07 DIAGNOSIS — E785 Hyperlipidemia, unspecified: Secondary | ICD-10-CM

## 2023-02-07 DIAGNOSIS — E1165 Type 2 diabetes mellitus with hyperglycemia: Secondary | ICD-10-CM

## 2023-02-07 DIAGNOSIS — E1159 Type 2 diabetes mellitus with other circulatory complications: Secondary | ICD-10-CM

## 2023-02-07 DIAGNOSIS — I152 Hypertension secondary to endocrine disorders: Secondary | ICD-10-CM | POA: Diagnosis not present

## 2023-02-07 DIAGNOSIS — G479 Sleep disorder, unspecified: Secondary | ICD-10-CM

## 2023-02-07 DIAGNOSIS — Z794 Long term (current) use of insulin: Secondary | ICD-10-CM

## 2023-02-07 NOTE — Assessment & Plan Note (Signed)
Well controlled.  Continue to work on eating a healthy diet and exercise.  Labs drawn today.   No major side effects reported, and no issues with compliance. The current medical regimen is effective;  continue present plan with *** Will adjust medication as needed depending on labs {BCLABVAL:29740}

## 2023-02-07 NOTE — Assessment & Plan Note (Signed)
Elevated blood pressure readings at home and in the office. Currently on Valsartan 80mg /Hydrochlorothiazide 12.5mg . Discussed the potential need to increase Valsartan dose, but also the need to monitor for orthostatic hypotension. -Check home blood pressure readings twice daily for a week (morning and night), ensuring proper technique (sitting for at least 10 minutes, legs uncrossed). -Return for a nurse visit next week to review blood pressure readings and adjust medication as needed.

## 2023-02-08 LAB — CBC WITH DIFFERENTIAL/PLATELET
Basophils Absolute: 0 10*3/uL (ref 0.0–0.2)
Basos: 0 %
EOS (ABSOLUTE): 0.1 10*3/uL (ref 0.0–0.4)
Eos: 1 %
Hematocrit: 45.3 % (ref 34.0–46.6)
Hemoglobin: 15.6 g/dL (ref 11.1–15.9)
Immature Grans (Abs): 0 10*3/uL (ref 0.0–0.1)
Immature Granulocytes: 0 %
Lymphocytes Absolute: 2.5 10*3/uL (ref 0.7–3.1)
Lymphs: 27 %
MCH: 31.1 pg (ref 26.6–33.0)
MCHC: 34.4 g/dL (ref 31.5–35.7)
MCV: 90 fL (ref 79–97)
Monocytes Absolute: 0.4 10*3/uL (ref 0.1–0.9)
Monocytes: 5 %
Neutrophils Absolute: 6.3 10*3/uL (ref 1.4–7.0)
Neutrophils: 67 %
Platelets: 278 10*3/uL (ref 150–450)
RBC: 5.01 x10E6/uL (ref 3.77–5.28)
RDW: 12.3 % (ref 11.7–15.4)
WBC: 9.4 10*3/uL (ref 3.4–10.8)

## 2023-02-08 LAB — COMPREHENSIVE METABOLIC PANEL
ALT: 11 [IU]/L (ref 0–32)
AST: 15 [IU]/L (ref 0–40)
Albumin: 4.6 g/dL (ref 3.8–4.8)
Alkaline Phosphatase: 68 [IU]/L (ref 44–121)
BUN/Creatinine Ratio: 18 (ref 12–28)
BUN: 12 mg/dL (ref 8–27)
Bilirubin Total: 0.3 mg/dL (ref 0.0–1.2)
CO2: 23 mmol/L (ref 20–29)
Calcium: 9.9 mg/dL (ref 8.7–10.3)
Chloride: 98 mmol/L (ref 96–106)
Creatinine, Ser: 0.65 mg/dL (ref 0.57–1.00)
Globulin, Total: 2.7 g/dL (ref 1.5–4.5)
Glucose: 157 mg/dL — ABNORMAL HIGH (ref 70–99)
Potassium: 4.7 mmol/L (ref 3.5–5.2)
Sodium: 139 mmol/L (ref 134–144)
Total Protein: 7.3 g/dL (ref 6.0–8.5)
eGFR: 92 mL/min/{1.73_m2} (ref >59)

## 2023-02-08 LAB — LIPID PANEL
Chol/HDL Ratio: 3.6 {ratio} (ref 0.0–4.4)
Cholesterol, Total: 183 mg/dL (ref 100–199)
HDL: 51 mg/dL (ref >39)
LDL Chol Calc (NIH): 111 mg/dL — ABNORMAL HIGH (ref 0–99)
Triglycerides: 120 mg/dL (ref 0–149)
VLDL Cholesterol Cal: 21 mg/dL (ref 5–40)

## 2023-02-08 LAB — HEMOGLOBIN A1C
Est. average glucose Bld gHb Est-mCnc: 174 mg/dL
Hgb A1c MFr Bld: 7.7 % — ABNORMAL HIGH (ref 4.8–5.6)

## 2023-02-09 ENCOUNTER — Encounter: Payer: Self-pay | Admitting: Physician Assistant

## 2023-02-09 DIAGNOSIS — G479 Sleep disorder, unspecified: Secondary | ICD-10-CM | POA: Insufficient documentation

## 2023-02-09 NOTE — Assessment & Plan Note (Signed)
Reports variable sleep patterns, waking up early, and sometimes needing to nap during the day. Recognizes the potential impact on blood pressure control. -Encouraged to continue listening to her body and prioritizing sleep for overall health.

## 2023-02-09 NOTE — Assessment & Plan Note (Signed)
Well controlled.  Continue to work on eating a healthy diet and exercise.  Labs drawn today.   Will adjust medication as needed depending on labs Lab Results  Component Value Date   LDLCALC 111 (H) 02/07/2023

## 2023-02-21 ENCOUNTER — Ambulatory Visit: Payer: PPO

## 2023-02-28 ENCOUNTER — Ambulatory Visit: Payer: PPO

## 2023-02-28 VITALS — BP 138/70

## 2023-02-28 DIAGNOSIS — I152 Hypertension secondary to endocrine disorders: Secondary | ICD-10-CM

## 2023-02-28 NOTE — Progress Notes (Signed)
Patient is here for blood pressure check. She is taking valsartan-hydrochlorothiazide 80-12.5 mg daily.  She denies chest pain, shortness of breath, lightlessness, dizziness.

## 2023-03-12 ENCOUNTER — Encounter: Payer: Self-pay | Admitting: Internal Medicine

## 2023-03-12 ENCOUNTER — Ambulatory Visit (INDEPENDENT_AMBULATORY_CARE_PROVIDER_SITE_OTHER): Payer: PPO | Admitting: Internal Medicine

## 2023-03-12 VITALS — BP 124/72 | HR 80 | Ht 64.5 in | Wt 178.0 lb

## 2023-03-12 DIAGNOSIS — E1142 Type 2 diabetes mellitus with diabetic polyneuropathy: Secondary | ICD-10-CM | POA: Diagnosis not present

## 2023-03-12 DIAGNOSIS — Z794 Long term (current) use of insulin: Secondary | ICD-10-CM

## 2023-03-12 DIAGNOSIS — E1165 Type 2 diabetes mellitus with hyperglycemia: Secondary | ICD-10-CM | POA: Diagnosis not present

## 2023-03-12 DIAGNOSIS — E785 Hyperlipidemia, unspecified: Secondary | ICD-10-CM | POA: Diagnosis not present

## 2023-03-12 MED ORDER — NOVOLOG FLEXPEN 100 UNIT/ML ~~LOC~~ SOPN: 12.0000 [IU] | PEN_INJECTOR | Freq: Three times a day (TID) | SUBCUTANEOUS | 3 refills | Status: DC

## 2023-03-12 MED ORDER — INSULIN PEN NEEDLE 32G X 4 MM MISC: 1.0000 | Freq: Four times a day (QID) | 3 refills | Status: DC

## 2023-03-12 MED ORDER — INSULIN DEGLUDEC FLEXTOUCH 200 UNIT/ML ~~LOC~~ SOPN: 50.0000 [IU] | PEN_INJECTOR | Freq: Every day | SUBCUTANEOUS | 5 refills | Status: DC

## 2023-03-12 MED ORDER — TIRZEPATIDE 7.5 MG/0.5ML ~~LOC~~ SOAJ: 7.5000 mg | SUBCUTANEOUS | 3 refills | Status: DC

## 2023-03-12 NOTE — Patient Instructions (Addendum)
Increased Mounjaro 7.5 mg once weekly  Continue Tresiba  50  units daily  Decrease Novolog 12 units with each meal       HOW TO TREAT LOW BLOOD SUGARS (Blood sugar LESS THAN 70 MG/DL) Please follow the RULE OF 15 for the treatment of hypoglycemia treatment (when your (blood sugars are less than 70 mg/dL)   STEP 1: Take 15 grams of carbohydrates when your blood sugar is low, which includes:  3-4 GLUCOSE TABS  OR 3-4 OZ OF JUICE OR REGULAR SODA OR ONE TUBE OF GLUCOSE GEL    STEP 2: RECHECK blood sugar in 15 MINUTES STEP 3: If your blood sugar is still low at the 15 minute recheck --> then, go back to STEP 1 and treat AGAIN with another 15 grams of carbohydrate

## 2023-03-12 NOTE — Progress Notes (Signed)
Name: Heather Waller  Age/ Sex: 73 y.o., female   MRN/ DOB: [id number], Jul 19, 1950     PCP: Heather Gauss, PA   Reason for Endocrinology Evaluation: Type 2 Diabetes Mellitus  Initial Endocrine Consultative Visit: 02/23/2020    PATIENT IDENTIFIER: [name] is a 73 y.o. female with a past medical history of T2DM, HTN , Hx breast CA (status postlumpectomy 07/2020 )and dyslipidemia. The patient has followed with Endocrinology clinic since 02/23/2020 for consultative assistance with management of her diabetes.     DIABETIC HISTORY:  [name] was diagnosed with DM yrs ago,Intolerant to Metformin due to diarrhea .  Her hemoglobin A1c has ranged from 10.7% in 2021, peaking at 10.8%  in 2022.    On her initial visit to our clinic she had an A1c of 10.8%, she was on [place] which we continued and added prandial insulin.  She was started on Farxiga by her PCP 03/2022 with an A1c 9.2% but it was out of stock, so she was started on Mounjaro    SUBJECTIVE:   During the last visit (11/07/2022): A1c 8.9 %      Today (03/12/2023): [name] is here for a follow up on diabetes management.  She checks her blood sugars 3  times daily. The patient has not had hypoglycemic episodes since the last clinic visit.   She continues to follow-up with oncology for history of breast cancer  Denies nausea or vomiting  She feels better on mounjaro  Denies constipation    HOME DIABETES REGIMEN:  Mounjaro 5 mg weekly Tresiba 50 units daily NovoLog 14 units TIDQAC     Statin: Declines (02/2020) ACE-I/ARB: Yes    GLUCOSE LOG:  142-201 mg/dL     DIABETIC COMPLICATIONS: Microvascular complications:  Neuropathy- chemo related  Denies: CKD, retinopathy Last Eye Exam: Completed 11/25/2022  Macrovascular complications:   Denies: CAD, CVA, PVD   HISTORY:  Past Medical History:  Past Medical History:  Diagnosis Date   Breast cancer (HCC)     Diabetes mellitus without complication (HCC)    Hyperlipidemia    Hypertension    Personal history of chemotherapy    Personal history of radiation therapy    Type 2 diabetes mellitus with hyperglycemia (HCC) 02/23/2020   Type 2 diabetes mellitus without complication, with long-term current use of insulin (HCC) 12/29/2020   Past Surgical History:  Past Surgical History:  Procedure Laterality Date   BREAST LUMPECTOMY Right 07/2020   Social History:  reports that she has never smoked. She has never used smokeless tobacco. She reports that she does not currently use alcohol. She reports that she does not use drugs. Family History:  Family History  Problem Relation Age of Onset   Alzheimer's disease Mother    Hypertension Father      HOME MEDICATIONS: Allergies as of 03/12/2023       Reactions   Latex Itching   Codeine    Iodine    Oxycodone         Medication List        Accurate as of March 12, 2023 11:14 AM. If you have any questions, ask your nurse or doctor.          anastrozole 1 MG tablet Commonly known as: ARIMIDEX TAKE 1 TABLET BY MOUTH EVERY DAY   Insulin Degludec FlexTouch 200 UNIT/ML Sopn 50 Units by Other route daily in the afternoon.   Insulin Pen Needle 32G X 4 MM Misc 1 Device  by Does not apply route in the morning, at noon, in the evening, and at bedtime.   NovoLOG FlexPen 100 UNIT/ML FlexPen Generic drug: insulin aspart Inject 14 Units into the skin with breakfast, with lunch, and with evening meal. Max daily 60 units   tirzepatide 5 MG/0.5ML Pen Commonly known as: MOUNJARO Inject 5 mg into the skin once a week.   valsartan-hydrochlorothiazide 80-12.5 MG tablet Commonly known as: DIOVAN-HCT Take 1 tablet by mouth daily.         OBJECTIVE:   Vital Signs: BP 124/72 (BP Location: Left Arm, Patient Position: Sitting, Cuff Size: Normal)   Pulse 80   Ht 5' 4.5" (1.638 m)   Wt 178 lb (80.7 kg)   SpO2 94%   BMI 30.08 kg/m   Wt  Readings from Last 3 Encounters:  03/12/23 178 lb (80.7 kg)  02/07/23 176 lb (79.8 kg)  01/07/23 178 lb 6.4 oz (80.9 kg)     Exam: General: Pt appears well and is in NAD  Lungs: Clear with good BS bilat   Heart: RRR   Extremities: No   pretibial edema.   Neuro: MS is good with appropriate affect, pt is alert and Ox3    DM foot exam:03/12/2023     The skin of the feet is intact without sores or ulcerations. The pedal pulses are 2+ on right and 2+ on left. The sensation is intact to a screening 5.07, 10 gram monofilament bilaterally    DATA REVIEWED:  Lab Results  Component Value Date   HGBA1C 7.7 (H) 02/07/2023   HGBA1C 8.9 (H) 10/30/2022   HGBA1C 8.2 (H) 07/24/2022    Latest Reference Range & Units 02/07/23 10:18  Sodium 134 - 144 mmol/L 139  Potassium 3.5 - 5.2 mmol/L 4.7  Chloride 96 - 106 mmol/L 98  CO2 20 - 29 mmol/L 23  Glucose 70 - 99 mg/dL 578 (H)  BUN 8 - 27 mg/dL 12  Creatinine 4.69 - 6.29 mg/dL 5.28  Calcium 8.7 - 41.3 mg/dL 9.9  BUN/Creatinine Ratio 12 - 28  18  eGFR >59 mL/min/1.73 92  Alkaline Phosphatase 44 - 121 IU/L 68  Albumin 3.8 - 4.8 g/dL 4.6  AST 0 - 40 IU/L 15  ALT 0 - 32 IU/L 11  Total Protein 6.0 - 8.5 g/dL 7.3  Total Bilirubin 0.0 - 1.2 mg/dL 0.3  Total CHOL/HDL Ratio 0.0 - 4.4 ratio 3.6  Cholesterol, Total 100 - 199 mg/dL 244  HDL Cholesterol >01 mg/dL 51  Triglycerides 0 - 027 mg/dL 253  VLDL Cholesterol Cal 5 - 40 mg/dL 21  LDL Chol Calc (NIH) 0 - 99 mg/dL 664 (H)  (H): Data is abnormally high Old records , labs and images have been reviewed.   ASSESSMENT / PLAN / RECOMMENDATIONS:   1) Type 2 Diabetes Mellitus, Sub-Optimally  controlled, With neuropathic  complications - Most recent A1c of  7.7%. Goal A1c < 7.0 %.    -A1c is trending down, but continues to be above goal -Patient reported constipation with Ozempic - She has issues with learning and maneuvering CGM in the past and opted with finger sticks  -Labs reviewed through  PCPs office -Will increase Mounjaro, and decrease NovoLog preemptively to prevent hypoglycemia  MEDICATIONS: Increase Mounjaro 7.5 mg weekly  Continue Tresiba 50 units daily Decrease NovoLog 12 units with each meal   EDUCATION / INSTRUCTIONS: BG monitoring instructions: Patient is instructed to check her blood sugars 3 times a day, before meals . Call  [facility] if: BG persistently < 70  I reviewed the Rule of 15 for the treatment of hypoglycemia in detail with the patient. Literature supplied.   2) Diabetic complications:  Eye: Does not have known diabetic retinopathy.  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  Renal: Patient does not have known baseline CKD. She   is  on an ACEI/ARB at present.   3) Dyslipidemia :  -Historically her LDL has been below 100 Mg/DL, her last LDL has increased in 01/2023 -We discussed cardiovascular benefits of statin therapy -Patient opted to remain off statin therapy and we will recheck either through our office or PCPs office in 6 months     F/U in 4 months     Signed electronically by: Lyndle Herrlich, MD  [facility] End[facility]  [facility] [address] [place] [place], [place] [address] Phone: [phone] FAX: [phone]   CC: Heather Gauss, PA [address] [place] [address] Phone: [phone]  Fax: [phone]  Return to Endocrinology clinic as below: Future Appointments  Date Time Provider Department Center  05/13/2023  7:40 AM Heather Gauss, PA [facility] None  06/17/2023 10:20 AM [facility]  07/02/2023 10:30 AM Weston Settle, MD [facility] None

## 2023-05-10 NOTE — Progress Notes (Unsigned)
 Subjective:  Patient ID: Heather Waller, female    DOB: 07-07-1950  Age: 73 y.o. MRN: [id number]  Chief Complaint  Patient presents with   Medical Management of Chronic Issues    Discussed the use of AI scribe software for clinical note transcription with the patient, who gave verbal consent to proceed.   Diabetes:  Complications: Getting the medicine Glucose checking: three times daily Glucose logs: 130-160  Hypoglycemia: none  Most recent A1C: 7.7% Current medications:  Novolog 14 units three times daily, Insulin degludec 50 units in the afternoon. Mounjaro 5 mg once a week. Last Eye Exam: October 2024  Foot checks: daily  See endocrinologist  Hypertension: Current medications: Valsartan -HCT 80-12.5 daily.   Hyperlipidemia: Patient is watching her diet.      02/07/2023    9:32 AM 10/30/2022    8:40 AM 07/24/2022    8:15 AM 03/21/2022   11:12 AM  Depression screen PHQ 2/9  Decreased Interest 0 0 0 0  Down, Depressed, Hopeless 0 0 0 0  PHQ - 2 Score 0 0 0 0  Altered sleeping 1 2 0   Tired, decreased energy 1 1 0   Change in appetite 0 0 0   Feeling bad or failure about yourself  0 0 0   Trouble concentrating 0 0 0   Moving slowly or fidgety/restless 0 0 0   Suicidal thoughts 0 0 0   PHQ-9 Score 2 3 0   Difficult doing work/chores Somewhat difficult Not difficult at all Not difficult at all         02/07/2023    9:31 AM  Fall Risk   Falls in the past year? 0  Number falls in past yr: 0  Injury with Fall? 0  Risk for fall due to : No Fall Risks    Patient Care Team: Langley Gauss, [place] as PCP - General (Physician Assistant) Weston Settle, MD as Consulting Physician (Oncology) Darleene Cleaver, MD as Consulting Physician (General Surgery)   Review of Systems  Current Outpatient Medications on File Prior to Visit  Medication Sig Dispense Refill   anastrozole (ARIMIDEX) 1 MG tablet TAKE 1 TABLET BY MOUTH EVERY DAY 90 tablet 3   insulin aspart  (NOVOLOG FLEXPEN) 100 UNIT/ML FlexPen Inject 12 Units into the skin with breakfast, with lunch, and with evening meal. Max daily 60 units 45 mL 3   Insulin Degludec FlexTouch 200 UNIT/ML SOPN 50 Units by Other route daily in the afternoon. 45 mL 5   Insulin Pen Needle 32G X 4 MM MISC 1 Device by Does not apply route in the morning, at noon, in the evening, and at bedtime. 400 each 3   tirzepatide (MOUNJARO) 7.5 MG/0.5ML Pen Inject 7.5 mg into the skin once a week. 6 mL 3   valsartan-hydrochlorothiazide (DIOVAN-HCT) 80-12.5 MG tablet Take 1 tablet by mouth daily. 90 tablet 3   No current facility-administered medications on file prior to visit.   Past Medical History:  Diagnosis Date   Breast cancer (HCC)    Diabetes mellitus without complication (HCC)    Hyperlipidemia    Hypertension    Personal history of chemotherapy    Personal history of radiation therapy    Type 2 diabetes mellitus with hyperglycemia (HCC) 02/23/2020   Type 2 diabetes mellitus without complication, with long-term current use of insulin (HCC) 12/29/2020   Past Surgical History:  Procedure Laterality Date   BREAST LUMPECTOMY Right 07/2020    Family  History  Problem Relation Age of Onset   Alzheimer's disease Mother    Hypertension Father    Social History   Socioeconomic History   Marital status: Married    Spouse name: Not on file   Number of children: Not on file   Years of education: Not on file   Highest education level: Not on file  Occupational History   Not on file  Tobacco Use   Smoking status: Never   Smokeless tobacco: Never  Vaping Use   Vaping status: Never Used  Substance and Sexual Activity   Alcohol use: Not Currently   Drug use: Never   Sexual activity: Yes    Partners: Male  Other Topics Concern   Not on file  Social History Narrative   Not on file   Social Drivers of Health   Financial Resource Strain: Not on file  Food Insecurity: Not on file  Transportation Needs: Not  on file  Physical Activity: Not on file  Stress: Not on file  Social Connections: Not on file    Objective:  There were no vitals taken for this visit.     03/12/2023   11:02 AM 02/28/2023    9:52 AM 02/07/2023   10:10 AM  BP/Weight  Systolic BP 124 138 160  Diastolic BP 72 70 80  Wt. (Lbs) 178    BMI 30.08 kg/m2      Physical Exam  Diabetic Foot Exam - Simple   No data filed      Lab Results  Component Value Date   WBC 9.4 02/07/2023   HGB 15.6 02/07/2023   HCT 45.3 02/07/2023   PLT 278 02/07/2023   GLUCOSE 157 (H) 02/07/2023   CHOL 183 02/07/2023   TRIG 120 02/07/2023   HDL 51 02/07/2023   LDLCALC 111 (H) 02/07/2023   ALT 11 02/07/2023   AST 15 02/07/2023   NA 139 02/07/2023   K 4.7 02/07/2023   CL 98 02/07/2023   CREATININE 0.65 02/07/2023   BUN 12 02/07/2023   CO2 23 02/07/2023   TSH 0.910 03/21/2022   HGBA1C 7.7 (H) 02/07/2023      Assessment & Plan:  Assessment and Plan       Hypertension associated with diabetes (HCC)  Type 2 diabetes mellitus with diabetic polyneuropathy, with long-term current use of insulin (HCC)  Dyslipidemia     No orders of the defined types were placed in this encounter.   No orders of the defined types were placed in this encounter.    Follow-up: No follow-ups on file.   I,Aurea Aronov I Leal-Borjas,acting as a scribe for [employer], PA.,have documented all relevant documentation on the behalf of Langley Gauss, PA,as directed by  Langley Gauss, PA while in the presence of Langley Gauss, [place].   An After Visit Summary was printed and given to the patient.  Langley Gauss, [place] [facility] [phone]

## 2023-05-13 ENCOUNTER — Ambulatory Visit: Payer: PPO | Admitting: Physician Assistant

## 2023-05-13 DIAGNOSIS — E1159 Type 2 diabetes mellitus with other circulatory complications: Secondary | ICD-10-CM

## 2023-05-13 DIAGNOSIS — E785 Hyperlipidemia, unspecified: Secondary | ICD-10-CM

## 2023-05-13 DIAGNOSIS — Z794 Long term (current) use of insulin: Secondary | ICD-10-CM

## 2023-05-14 NOTE — Progress Notes (Signed)
 This encounter was created in error - please disregard.

## 2023-05-23 ENCOUNTER — Telehealth: Payer: Self-pay

## 2023-05-23 NOTE — Telephone Encounter (Signed)
 Pharmacy Patient Advocate Encounter   Received notification from CoverMyMeds that prior authorization for [facility] is required/requested.   Insurance verification completed.   The patient is insured through [facility] ADVANTAGE/RX ADVANCE .   Per test claim: PA required and submitted KEY/EOC/Request #: [id number] APPROVED from 05/23/23 to 05/22/24

## 2023-05-26 NOTE — Progress Notes (Unsigned)
 Subjective:  Patient ID: Heather Waller, female    DOB: 07-Dec-1950  Age: 73 y.o. MRN: [id number]  No chief complaint on file.   Discussed the use of AI scribe software for clinical note transcription with the patient, who gave verbal consent to proceed.         02/07/2023    9:32 AM 10/30/2022    8:40 AM 07/24/2022    8:15 AM 03/21/2022   11:12 AM  Depression screen PHQ 2/9  Decreased Interest 0 0 0 0  Down, Depressed, Hopeless 0 0 0 0  PHQ - 2 Score 0 0 0 0  Altered sleeping 1 2 0   Tired, decreased energy 1 1 0   Change in appetite 0 0 0   Feeling bad or failure about yourself  0 0 0   Trouble concentrating 0 0 0   Moving slowly or fidgety/restless 0 0 0   Suicidal thoughts 0 0 0   PHQ-9 Score 2 3 0   Difficult doing work/chores Somewhat difficult Not difficult at all Not difficult at all         02/07/2023    9:31 AM  Fall Risk   Falls in the past year? 0  Number falls in past yr: 0  Injury with Fall? 0  Risk for fall due to : No Fall Risks    Patient Care Team: Langley Gauss, [place] as PCP - General (Physician Assistant) Weston Settle, MD as Consulting Physician (Oncology) Darleene Cleaver, MD as Consulting Physician (General Surgery)   Review of Systems  Constitutional:  Negative for appetite change, fatigue and fever.  HENT:  Negative for congestion, ear pain, sinus pressure and sore throat.   Respiratory:  Negative for cough, chest tightness, shortness of breath and wheezing.   Cardiovascular:  Negative for chest pain and palpitations.  Gastrointestinal:  Negative for abdominal pain, constipation, diarrhea, nausea and vomiting.  Genitourinary:  Negative for dysuria and hematuria.  Musculoskeletal:  Negative for arthralgias, back pain, joint swelling and myalgias.  Skin:  Negative for rash.  Neurological:  Negative for dizziness, weakness and headaches.  Psychiatric/Behavioral:  Negative for dysphoric mood. The patient is not nervous/anxious.      Current Outpatient Medications on File Prior to Visit  Medication Sig Dispense Refill   anastrozole (ARIMIDEX) 1 MG tablet TAKE 1 TABLET BY MOUTH EVERY DAY 90 tablet 3   insulin aspart (NOVOLOG FLEXPEN) 100 UNIT/ML FlexPen Inject 12 Units into the skin with breakfast, with lunch, and with evening meal. Max daily 60 units 45 mL 3   Insulin Degludec FlexTouch 200 UNIT/ML SOPN 50 Units by Other route daily in the afternoon. 45 mL 5   Insulin Pen Needle 32G X 4 MM MISC 1 Device by Does not apply route in the morning, at noon, in the evening, and at bedtime. 400 each 3   tirzepatide (MOUNJARO) 7.5 MG/0.5ML Pen Inject 7.5 mg into the skin once a week. 6 mL 3   valsartan-hydrochlorothiazide (DIOVAN-HCT) 80-12.5 MG tablet Take 1 tablet by mouth daily. 90 tablet 3   No current facility-administered medications on file prior to visit.   Past Medical History:  Diagnosis Date   Breast cancer (HCC)    Diabetes mellitus without complication (HCC)    Hyperlipidemia    Hypertension    Personal history of chemotherapy    Personal history of radiation therapy    Type 2 diabetes mellitus with hyperglycemia (HCC) 02/23/2020   Type 2 diabetes mellitus  without complication, with long-term current use of insulin (HCC) 12/29/2020   Past Surgical History:  Procedure Laterality Date   BREAST LUMPECTOMY Right 07/2020    Family History  Problem Relation Age of Onset   Alzheimer's disease Mother    Hypertension Father    Social History   Socioeconomic History   Marital status: Married    Spouse name: Not on file   Number of children: Not on file   Years of education: Not on file   Highest education level: Not on file  Occupational History   Not on file  Tobacco Use   Smoking status: Never   Smokeless tobacco: Never  Vaping Use   Vaping status: Never Used  Substance and Sexual Activity   Alcohol use: Not Currently   Drug use: Never   Sexual activity: Yes    Partners: Male  Other Topics  Concern   Not on file  Social History Narrative   Not on file   Social Drivers of Health   Financial Resource Strain: Not on file  Food Insecurity: Not on file  Transportation Needs: Not on file  Physical Activity: Not on file  Stress: Not on file  Social Connections: Not on file    Objective:  There were no vitals taken for this visit.     03/12/2023   11:02 AM 02/28/2023    9:52 AM 02/07/2023   10:10 AM  BP/Weight  Systolic BP 124 138 160  Diastolic BP 72 70 80  Wt. (Lbs) 178    BMI 30.08 kg/m2      Physical Exam  Diabetic Foot Exam - Simple   No data filed      Lab Results  Component Value Date   WBC 9.4 02/07/2023   HGB 15.6 02/07/2023   HCT 45.3 02/07/2023   PLT 278 02/07/2023   GLUCOSE 157 (H) 02/07/2023   CHOL 183 02/07/2023   TRIG 120 02/07/2023   HDL 51 02/07/2023   LDLCALC 111 (H) 02/07/2023   ALT 11 02/07/2023   AST 15 02/07/2023   NA 139 02/07/2023   K 4.7 02/07/2023   CL 98 02/07/2023   CREATININE 0.65 02/07/2023   BUN 12 02/07/2023   CO2 23 02/07/2023   TSH 0.910 03/21/2022   HGBA1C 7.7 (H) 02/07/2023      Assessment & Plan:  Assessment and Plan       Diabetic polyneuropathy associated with type 2 diabetes mellitus (HCC)  Type 2 diabetes mellitus with hyperglycemia, with long-term current use of insulin (HCC)  Hypertension associated with diabetes (HCC)  Dyslipidemia  Sleep disturbances     No orders of the defined types were placed in this encounter.   No orders of the defined types were placed in this encounter.    Follow-up: No follow-ups on file.   I,Lauren M Auman,acting as a Neurosurgeon for [employer], PA.,have documented all relevant documentation on the behalf of Odilia Bennett, PA,as directed by  Odilia Bennett, PA while in the presence of Odilia Bennett, [place].   An After Visit Summary was printed and given to the patient.  Odilia Bennett, [place] [facility] [phone]

## 2023-05-27 ENCOUNTER — Encounter: Payer: Self-pay | Admitting: Internal Medicine

## 2023-05-27 ENCOUNTER — Ambulatory Visit (INDEPENDENT_AMBULATORY_CARE_PROVIDER_SITE_OTHER): Admitting: Physician Assistant

## 2023-05-27 ENCOUNTER — Encounter: Payer: Self-pay | Admitting: Physician Assistant

## 2023-05-27 VITALS — BP 138/82 | HR 74 | Temp 97.1°F | Ht 64.5 in | Wt 175.2 lb

## 2023-05-27 DIAGNOSIS — E1165 Type 2 diabetes mellitus with hyperglycemia: Secondary | ICD-10-CM

## 2023-05-27 DIAGNOSIS — E1142 Type 2 diabetes mellitus with diabetic polyneuropathy: Secondary | ICD-10-CM | POA: Diagnosis not present

## 2023-05-27 DIAGNOSIS — Z794 Long term (current) use of insulin: Secondary | ICD-10-CM

## 2023-05-27 DIAGNOSIS — E785 Hyperlipidemia, unspecified: Secondary | ICD-10-CM | POA: Diagnosis not present

## 2023-05-27 DIAGNOSIS — E1159 Type 2 diabetes mellitus with other circulatory complications: Secondary | ICD-10-CM | POA: Diagnosis not present

## 2023-05-27 DIAGNOSIS — I152 Hypertension secondary to endocrine disorders: Secondary | ICD-10-CM

## 2023-05-27 DIAGNOSIS — G479 Sleep disorder, unspecified: Secondary | ICD-10-CM

## 2023-05-27 NOTE — Assessment & Plan Note (Signed)
 Well controlled.  Continue to work on eating a healthy diet and exercise.  Labs drawn today.   Will adjust medication as needed depending on labs Lab Results  Component Value Date   LDLCALC 111 (H) 02/07/2023

## 2023-05-27 NOTE — Patient Instructions (Signed)
 VISIT SUMMARY:  You came in today for a routine check-up. We discussed your blood pressure, diabetes management, medication, and general health maintenance. You also shared your plans to incorporate more exercise into your routine and your concerns about bone density and potential future screenings.  YOUR PLAN:  -TYPE 2 DIABETES MELLITUS: Type 2 diabetes is a condition where your body does not use insulin properly, leading to high blood sugar levels. We will check your A1c and kidney function through lab tests. It's important to monitor for protein in your urine to prevent kidney damage. Continue with your exercise and lifestyle modifications.  -HYPERTENSION: Hypertension, or high blood pressure, can lead to serious health issues if not managed. Your readings have been slightly elevated but are improving. Keep monitoring your blood pressure regularly and continue exercising, especially stair climbing with handrails for safety.  -MEDICATION MANAGEMENT: You are awaiting approval and processing of your Mounjaro (tirzepatide) 7.5 mg prescription. Please follow up with your pharmacy to check on its status.  -GENERAL HEALTH MAINTENANCE: You have received your shingles and pneumonia vaccines, which is great. We discussed the possibility of a colonoscopy after you turn 73 and the importance of monitoring your bone density due to your history of chemotherapy and radiation. Make sure to discuss bone density monitoring with your oncologist in May.  INSTRUCTIONS:  Please schedule a follow-up appointment in four months for ongoing health management. Contact your pharmacy to check on the status of your Mounjaro prescription. Continue with regular blood pressure monitoring and exercise. We will also need to perform lab tests to check your A1c and urine for proteinuria.

## 2023-05-27 NOTE — Assessment & Plan Note (Signed)
 Denies any worsening or changing symptoms Continue to monitor Will discuss treatment if symptoms worsen

## 2023-05-27 NOTE — Assessment & Plan Note (Signed)
 Type 2 diabetes managed with exercise and lifestyle modifications. Labs to assess A1c and kidney function. Discussed importance of monitoring proteinuria for diabetic nephropathy. - Order A1c and urine tests for proteinuria. - Encourage exercise and lifestyle modifications.

## 2023-05-27 NOTE — Assessment & Plan Note (Signed)
 Hypertension with slightly elevated but improved readings. Regular monitoring and exercise advised to manage blood pressure and stabilize glucose levels. - Advise regular blood pressure monitoring. - Encourage exercise, particularly using stairs with handrails for safety.

## 2023-05-28 ENCOUNTER — Other Ambulatory Visit (HOSPITAL_BASED_OUTPATIENT_CLINIC_OR_DEPARTMENT_OTHER): Payer: Self-pay

## 2023-05-28 LAB — CBC WITH DIFFERENTIAL/PLATELET

## 2023-05-28 LAB — COMPREHENSIVE METABOLIC PANEL WITH GFR
ALT: 23 IU/L (ref 0–32)
AST: 54 IU/L — ABNORMAL HIGH (ref 0–40)
Albumin: 4.6 g/dL (ref 3.8–4.8)
Alkaline Phosphatase: 69 IU/L (ref 44–121)
BUN/Creatinine Ratio: 18 (ref 12–28)
BUN: 11 mg/dL (ref 8–27)
Bilirubin Total: 0.2 mg/dL (ref 0.0–1.2)
CO2: 17 mmol/L — ABNORMAL LOW (ref 20–29)
Calcium: 9.9 mg/dL (ref 8.7–10.3)
Chloride: 97 mmol/L (ref 96–106)
Creatinine, Ser: 0.62 mg/dL (ref 0.57–1.00)
Globulin, Total: 3.2 g/dL (ref 1.5–4.5)
Glucose: 164 mg/dL — ABNORMAL HIGH (ref 70–99)
Potassium: 6 mmol/L — ABNORMAL HIGH (ref 3.5–5.2)
Sodium: 138 mmol/L (ref 134–144)
Total Protein: 7.8 g/dL (ref 6.0–8.5)
eGFR: 93 mL/min/{1.73_m2} (ref >59)

## 2023-05-28 LAB — HEMOGLOBIN A1C
Est. average glucose Bld gHb Est-mCnc: 166 mg/dL
Hgb A1c MFr Bld: 7.4 % — ABNORMAL HIGH (ref 4.8–5.6)

## 2023-05-28 LAB — LIPID PANEL
Chol/HDL Ratio: 3.5 ratio (ref 0.0–4.4)
Cholesterol, Total: 190 mg/dL (ref 100–199)
HDL: 54 mg/dL (ref >39)
LDL Chol Calc (NIH): 110 mg/dL — ABNORMAL HIGH (ref 0–99)
Triglycerides: 146 mg/dL (ref 0–149)
VLDL Cholesterol Cal: 26 mg/dL (ref 5–40)

## 2023-05-28 LAB — MICROALBUMIN / CREATININE URINE RATIO
Creatinine, Urine: 71.5 mg/dL
Microalb/Creat Ratio: 11 mg/g{creat} (ref 0–29)
Microalbumin, Urine: 7.6 ug/mL

## 2023-05-28 MED ORDER — TIRZEPATIDE 7.5 MG/0.5ML ~~LOC~~ SOAJ
7.5000 mg | SUBCUTANEOUS | 3 refills | Status: DC
Start: 2023-05-28 — End: 2023-10-22
  Filled 2023-05-28: qty 6, 84d supply, fill #0
  Filled 2023-07-31 (×2): qty 6, 84d supply, fill #1

## 2023-05-28 NOTE — Telephone Encounter (Signed)
 Pt Mounjaro approved. Called pt to get more information, pt stated that she always getting the run around with [facility], Pt requested me to resent Rx to the [facility] pharmacy in [place]. Rx sent.

## 2023-05-29 ENCOUNTER — Other Ambulatory Visit (HOSPITAL_BASED_OUTPATIENT_CLINIC_OR_DEPARTMENT_OTHER): Payer: Self-pay

## 2023-05-30 ENCOUNTER — Other Ambulatory Visit: Payer: Self-pay | Admitting: Internal Medicine

## 2023-06-02 ENCOUNTER — Encounter: Payer: Self-pay | Admitting: Physician Assistant

## 2023-06-03 ENCOUNTER — Other Ambulatory Visit: Payer: Self-pay | Admitting: "Endocrinology

## 2023-06-03 ENCOUNTER — Encounter: Payer: Self-pay | Admitting: Physician Assistant

## 2023-06-10 ENCOUNTER — Other Ambulatory Visit: Payer: Self-pay

## 2023-06-10 DIAGNOSIS — R748 Abnormal levels of other serum enzymes: Secondary | ICD-10-CM

## 2023-06-11 ENCOUNTER — Other Ambulatory Visit

## 2023-06-11 DIAGNOSIS — R748 Abnormal levels of other serum enzymes: Secondary | ICD-10-CM

## 2023-06-12 LAB — COMPREHENSIVE METABOLIC PANEL WITH GFR
ALT: 18 IU/L (ref 0–32)
AST: 16 IU/L (ref 0–40)
Albumin: 4.5 g/dL (ref 3.8–4.8)
Alkaline Phosphatase: 73 IU/L (ref 44–121)
BUN/Creatinine Ratio: 19 (ref 12–28)
BUN: 11 mg/dL (ref 8–27)
Bilirubin Total: 0.2 mg/dL (ref 0.0–1.2)
CO2: 22 mmol/L (ref 20–29)
Calcium: 9.9 mg/dL (ref 8.7–10.3)
Chloride: 95 mmol/L — ABNORMAL LOW (ref 96–106)
Creatinine, Ser: 0.57 mg/dL (ref 0.57–1.00)
Globulin, Total: 2.5 g/dL (ref 1.5–4.5)
Glucose: 143 mg/dL — ABNORMAL HIGH (ref 70–99)
Potassium: 4.5 mmol/L (ref 3.5–5.2)
Sodium: 134 mmol/L (ref 134–144)
Total Protein: 7 g/dL (ref 6.0–8.5)
eGFR: 95 mL/min/{1.73_m2} (ref >59)

## 2023-06-13 ENCOUNTER — Encounter: Payer: Self-pay | Admitting: Physician Assistant

## 2023-06-16 ENCOUNTER — Ambulatory Visit: Payer: PPO

## 2023-06-17 ENCOUNTER — Ambulatory Visit
Admission: RE | Admit: 2023-06-17 | Discharge: 2023-06-17 | Disposition: A | Discharge status: Home / Self Care | Payer: PPO | Source: Ambulatory Visit | Attending: Surgery

## 2023-06-17 DIAGNOSIS — Z853 Personal history of malignant neoplasm of breast: Secondary | ICD-10-CM

## 2023-06-18 ENCOUNTER — Ambulatory Visit (INDEPENDENT_AMBULATORY_CARE_PROVIDER_SITE_OTHER): Admitting: Internal Medicine

## 2023-06-18 ENCOUNTER — Encounter: Payer: Self-pay | Admitting: Internal Medicine

## 2023-06-18 VITALS — BP 124/80 | HR 80 | Ht 64.5 in | Wt 175.0 lb

## 2023-06-18 DIAGNOSIS — E1165 Type 2 diabetes mellitus with hyperglycemia: Secondary | ICD-10-CM | POA: Diagnosis not present

## 2023-06-18 DIAGNOSIS — R5383 Other fatigue: Secondary | ICD-10-CM

## 2023-06-18 DIAGNOSIS — Z794 Long term (current) use of insulin: Secondary | ICD-10-CM

## 2023-06-18 DIAGNOSIS — E1142 Type 2 diabetes mellitus with diabetic polyneuropathy: Secondary | ICD-10-CM

## 2023-06-18 NOTE — Progress Notes (Unsigned)
 Name: Heather Waller  Age/ Sex: 73 y.o., female   MRN/ DOB: [id number], February 28, 1950     PCP: Odilia Bennett, PA   Reason for Endocrinology Evaluation: Type 2 Diabetes Mellitus  Initial Endocrine Consultative Visit: 02/23/2020    PATIENT IDENTIFIER: Heather Waller is a 73 y.o. female with a past medical history of T2DM, HTN , Hx breast CA (status postlumpectomy 07/2020 )and dyslipidemia. The patient has followed with Endocrinology clinic since 02/23/2020 for consultative assistance with management of her diabetes.     DIABETIC HISTORY:  Heather Waller was diagnosed with DM yrs ago,Intolerant to Metformin due to diarrhea .  Her hemoglobin A1c has ranged from 10.7% in 2021, peaking at 10.8%  in 2022.    On her initial visit to our clinic she had an A1c of 10.8%, she was on Ozempic  and Tresiba  which we continued and added prandial insulin .  She was started on Farxiga  by her PCP 03/2022 with an A1c 9.2% but it was out of stock, so she was started on Mounjaro     SUBJECTIVE:   During the last visit (03/12/2023): A1c 7.7 %      Today (06/18/2023): Heather Waller is here for a follow up on diabetes management.  She checks her blood sugars 3 times daily. The patient has not had hypoglycemic episodes since the last clinic visit.   She continues to follow-up with oncology for history of breast cancer  Denies nausea or vomiting  Has been tired and sleeping more during the day  Denies constipation , has rare diarrhea due to eating gluten    HOME DIABETES REGIMEN:  Mounjaro  7.5 mg weekly Tresiba  50 units daily NovoLog  12 units TIDQAC    Statin: Declines (02/2020) ACE-I/ARB: Yes    GLUCOSE LOG:  142-201 mg/dL     DIABETIC COMPLICATIONS: Microvascular complications:  Neuropathy- chemo related  Denies: CKD, retinopathy Last Eye Exam: Completed 11/25/2022  Macrovascular complications:   Denies: CAD, CVA, PVD   HISTORY:  Past Medical History:  Past Medical  History:  Diagnosis Date   Breast cancer (HCC)    Diabetes mellitus without complication (HCC)    Hyperlipidemia    Hypertension    Personal history of chemotherapy    Personal history of radiation therapy    Type 2 diabetes mellitus with hyperglycemia (HCC) 02/23/2020   Type 2 diabetes mellitus without complication, with long-term current use of insulin  (HCC) 12/29/2020   Past Surgical History:  Past Surgical History:  Procedure Laterality Date   BREAST LUMPECTOMY Right 07/2020   Social History:  reports that she has never smoked. She has never used smokeless tobacco. She reports that she does not currently use alcohol. She reports that she does not use drugs. Family History:  Family History  Problem Relation Age of Onset   Alzheimer's disease Mother    Hypertension Father      HOME MEDICATIONS: Allergies as of 06/18/2023       Reactions   Latex Itching   Codeine    Iodine    Oxycodone          Medication List        Accurate as of Jun 18, 2023 12:00 PM. If you have any questions, ask your nurse or doctor.          anastrozole  1 MG tablet Commonly known as: ARIMIDEX  TAKE 1 TABLET BY MOUTH EVERY DAY   Insulin  Pen Needle 32G X 4 MM Misc 1 Device by Does not apply route in the  morning, at noon, in the evening, and at bedtime.   Mounjaro  7.5 MG/0.5ML Pen Generic drug: tirzepatide  Inject 7.5 mg into the skin once a week.   NovoLOG  FlexPen 100 UNIT/ML FlexPen Generic drug: insulin  aspart Inject 12 Units into the skin with breakfast, with lunch, and with evening meal. Max daily 60 units   Tresiba  FlexTouch 200 UNIT/ML FlexTouch Pen Generic drug: insulin  degludec INJECT 50 UNITS INTO SKIN DAILY   valsartan -hydrochlorothiazide  80-12.5 MG tablet Commonly known as: DIOVAN -HCT Take 1 tablet by mouth daily.         OBJECTIVE:   Vital Signs: BP 124/80 (BP Location: Left Arm, Patient Position: Sitting, Cuff Size: Normal)   Pulse 80   Ht 5' 4.5" (1.638 m)    Wt 175 lb (79.4 kg)   SpO2 97%   BMI 29.57 kg/m   Wt Readings from Last 3 Encounters:  06/18/23 175 lb (79.4 kg)  05/27/23 175 lb 3.2 oz (79.5 kg)  03/12/23 178 lb (80.7 kg)     Exam: General: Pt appears well and is in NAD  Lungs: Clear with good BS bilat   Heart: RRR   Extremities: No   pretibial edema.   Neuro: MS is good with appropriate affect, pt is alert and Ox3    DM foot exam:03/12/2023     The skin of the feet is intact without sores or ulcerations. The pedal pulses are 2+ on right and 2+ on left. The sensation is intact to a screening 5.07, 10 gram monofilament bilaterally    DATA REVIEWED:  Lab Results  Component Value Date   HGBA1C 7.4 (H) 05/27/2023   HGBA1C 7.7 (H) 02/07/2023   HGBA1C 8.9 (H) 10/30/2022    Latest Reference Range & Units 06/18/23 12:25  TSH 0.40 - 4.50 mIU/L 0.81  Triiodothyronine,Free,Serum 2.3 - 4.2 pg/mL 3.4  T4,Free(Direct) 0.8 - 1.8 ng/dL 1.1      Latest Reference Range & Units 06/11/23 10:09  Sodium 134 - 144 mmol/L 134  Potassium 3.5 - 5.2 mmol/L 4.5  Chloride 96 - 106 mmol/L 95 (L)  CO2 20 - 29 mmol/L 22  Glucose 70 - 99 mg/dL 657 (H)  BUN 8 - 27 mg/dL 11  Creatinine 8.46 - 9.62 mg/dL 9.52  Calcium 8.7 - 84.1 mg/dL 9.9  BUN/Creatinine Ratio 12 - 28  19  eGFR >59 mL/min/1.73 95  Alkaline Phosphatase 44 - 121 IU/L 73  Albumin 3.8 - 4.8 g/dL 4.5  AST 0 - 40 IU/L 16  ALT 0 - 32 IU/L 18  Total Protein 6.0 - 8.5 g/dL 7.0  Total Bilirubin 0.0 - 1.2 mg/dL 0.2    Latest Reference Range & Units 05/27/23 09:23  Total CHOL/HDL Ratio 0.0 - 4.4 ratio 3.5  Cholesterol, Total 100 - 199 mg/dL 324  HDL Cholesterol >40 mg/dL 54  MICROALB/CREAT RATIO 0 - 29 mg/g creat 11  Triglycerides 0 - 149 mg/dL 102  VLDL Cholesterol Cal 5 - 40 mg/dL 26  LDL Chol Calc (NIH) 0 - 99 mg/dL 725 (H)    Latest Reference Range & Units 05/27/23 09:23  Microalbumin, Urine Not Estab. ug/mL 7.6  MICROALB/CREAT RATIO 0 - 29 mg/g creat 11  Creatinine,  Urine Not Estab. mg/dL 36.6    Old records , labs and images have been reviewed.   ASSESSMENT / PLAN / RECOMMENDATIONS:   1) Type 2 Diabetes Mellitus, Sub-Optimally  controlled, With neuropathic  complications - Most recent A1c of  7.4%. Goal A1c < 7.0 %.    -  A1c is trending down, but continues to be above goal -Patient reported constipation with Ozempic  - She had issues with learning and maneuvering CGM in the past and opted with finger sticks  -Labs reviewed through PCPs office - Patient opted to remain on the current dose of Mounjaro  -We discussed increasing physical activity, no other changes at this time    MEDICATIONS: Continue Mounjaro  7.5 mg weekly  Continue Tresiba  50 units daily Continue NovoLog  12 units with each meal   EDUCATION / INSTRUCTIONS: BG monitoring instructions: Patient is instructed to check her blood sugars 3 times a day, before meals . Call [facility] if: BG persistently < 70  I reviewed the Rule of 15 for the treatment of hypoglycemia in detail with the patient. Literature supplied.   2) Diabetic complications:  Eye: Does not have known diabetic retinopathy.  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  Renal: Patient does not have known baseline CKD. She   is  on an ACEI/ARB at present.      3) Dyslipidemia :  -Historically her LDL has been below 100 Mg/DL, her last LDL has increased in 01/2023 -We discussed cardiovascular benefits of statin therapy -Patient opted to remain off statin therapy    4) Fatigue :  -She has been more tired lately, she went from not sleeping after sleeping more than usual - TFTs are normal  F/U in 4 months     Signed electronically by: Natale Bail, MD  [facility] Endocrinology  [facility] [address] [place] [place], [place] [address] Phone: [phone] FAX: [phone]   CC: Odilia Bennett, PA [address] [place]  [address] Phone: [phone]  Fax: [phone]  Return to Endocrinology clinic as below: Future Appointments  Date Time Provider Department Center  06/18/2023 12:10 PM Chaston Bradburn, Julian Obey, MD [facility] None  07/02/2023 10:30 AM Deloria Fetch, MD [facility] None  09/26/2023  8:00 AM Odilia Bennett, PA [facility] None

## 2023-06-18 NOTE — Patient Instructions (Addendum)
 Continue Mounjaro  7.5 mg once weekly  Continue Tresiba   50  units daily  Continue  Novolog  12 units with each meal     HOW TO TREAT LOW BLOOD SUGARS (Blood sugar LESS THAN 70 MG/DL) Please follow the RULE OF 15 for the treatment of hypoglycemia treatment (when your (blood sugars are less than 70 mg/dL)   STEP 1: Take 15 grams of carbohydrates when your blood sugar is low, which includes:  3-4 GLUCOSE TABS  OR 3-4 OZ OF JUICE OR REGULAR SODA OR ONE TUBE OF GLUCOSE GEL    STEP 2: RECHECK blood sugar in 15 MINUTES STEP 3: If your blood sugar is still low at the 15 minute recheck --> then, go back to STEP 1 and treat AGAIN with another 15 grams of carbohydrate

## 2023-06-19 ENCOUNTER — Encounter: Payer: Self-pay | Admitting: Internal Medicine

## 2023-06-19 LAB — T4, FREE: Free T4: 1.1 ng/dL (ref 0.8–1.8)

## 2023-06-19 LAB — TSH: TSH: 0.81 m[IU]/L (ref 0.40–4.50)

## 2023-06-19 LAB — T3, FREE: T3, Free: 3.4 pg/mL (ref 2.3–4.2)

## 2023-06-24 ENCOUNTER — Other Ambulatory Visit: Payer: Self-pay | Admitting: Surgery

## 2023-06-24 DIAGNOSIS — Z1231 Encounter for screening mammogram for malignant neoplasm of breast: Secondary | ICD-10-CM

## 2023-07-01 NOTE — Progress Notes (Signed)
 [facility]  [address] [place],  [place]  [address] [phone]  Clinic Day:  07/02/2023  Referring physician: Odilia Bennett, PA  HISTORY OF PRESENT ILLNESS:  The patient is a 73 y.o. female with stage IA (T1c N0 M0) hormone/Her2 positive right breast cancer, status post a lumpectomy in June 2022.  She completed 6 cycles of adjuvant TCH chemotherapy in October 2022. She completed her maintenance Herceptin  in June 2023.  She remains on anastrozole  for her adjuvant endocrine therapy.  She comes in today for routine follow-up.  Since her last visit, the patient has been doing well.  She denies having any particular changes in her breasts which concern her for disease recurrence.  Of note, her annual mammogram done earlier this month showed no evidence of disease recurrence.  VITALS:  Blood pressure (!) 156/69, pulse 86, temperature 98.4 F (36.9 C), temperature source Oral, resp. rate 18, height 5' 4.5" (1.638 m), weight 176 lb (79.8 kg), SpO2 96%.  Wt Readings from Last 3 Encounters:  07/02/23 176 lb (79.8 kg)  06/18/23 175 lb (79.4 kg)  05/27/23 175 lb 3.2 oz (79.5 kg)    Body mass index is 29.74 kg/m.  Performance status (ECOG): 1  PHYSICAL EXAM:  Physical Exam Constitutional:      General: She is not in acute distress.    Appearance: Normal appearance. She is normal weight.  HENT:     Head: Normocephalic and atraumatic.     Mouth/Throat:     Pharynx: Oropharynx is clear. No oropharyngeal exudate.  Eyes:     General: No scleral icterus.    Extraocular Movements: Extraocular movements intact.     Conjunctiva/sclera: Conjunctivae normal.     Pupils: Pupils are equal, round, and reactive to light.  Cardiovascular:     Rate and Rhythm: Normal rate and regular rhythm.     Pulses: Normal pulses.     Heart sounds: Normal heart sounds. No murmur heard.    No friction rub. No gallop.  Pulmonary:     Effort: Pulmonary effort is normal. No  respiratory distress.     Breath sounds: Normal breath sounds.  Chest:  Breasts:    Right: No swelling, bleeding, inverted nipple, mass, nipple discharge or skin change.     Left: No swelling, bleeding, inverted nipple, mass, nipple discharge or skin change.  Abdominal:     General: Bowel sounds are normal. There is no distension.     Palpations: Abdomen is soft. There is no hepatomegaly, splenomegaly or mass.     Tenderness: There is no abdominal tenderness.  Musculoskeletal:        General: No tenderness. Normal range of motion.     Cervical back: Normal range of motion and neck supple.     Right lower leg: No edema.     Left lower leg: No edema.  Lymphadenopathy:     Cervical: No cervical adenopathy.     Right cervical: No superficial, deep or posterior cervical adenopathy.    Left cervical: No superficial, deep or posterior cervical adenopathy.     Upper Body:     Right upper body: No supraclavicular or axillary adenopathy.     Left upper body: No supraclavicular or axillary adenopathy.     Lower Body: No right inguinal adenopathy. No left inguinal adenopathy.  Skin:    General: Skin is warm and dry.     Coloration: Skin is not jaundiced.     Findings: No lesion or  rash.  Neurological:     General: No focal deficit present.     Mental Status: She is alert and oriented to person, place, and time. Mental status is at baseline.  Psychiatric:        Mood and Affect: Mood normal.        Behavior: Behavior normal.        Thought Content: Thought content normal.        Judgment: Judgment normal.    ASSESSMENT & PLAN:  Assessment/Plan:  A  73 y.o. female with stage IA (T1c N0 M0) hormone/Her2 positive breast cancer, status post a right breast lumpectomy in June 2022, followed by [facility] x 6 and maintenance Herceptin  x 1 year.  Based upon her clinical breast exam today, the patient remains disease-free.  She knows to continue taking her anastrozole  daily x 5 years for her adjuvant  endocrine therapy.  Overall, she appears to be doing well.  I we will see her back in 6 months for her next clinical breast exam.  The patient understands all the plans discussed today and is in agreement with them.    Veronnica Hennings Felicia Horde, MD

## 2023-07-02 ENCOUNTER — Other Ambulatory Visit: Payer: Self-pay

## 2023-07-02 ENCOUNTER — Inpatient Hospital Stay: Payer: PPO | Attending: Oncology | Admitting: Oncology

## 2023-07-02 ENCOUNTER — Telehealth: Payer: Self-pay | Admitting: Oncology

## 2023-07-02 VITALS — BP 156/69 | HR 86 | Temp 98.4°F | Resp 18 | Ht 64.5 in | Wt 176.0 lb

## 2023-07-02 DIAGNOSIS — C50411 Malignant neoplasm of upper-outer quadrant of right female breast: Secondary | ICD-10-CM | POA: Diagnosis not present

## 2023-07-02 DIAGNOSIS — Z79811 Long term (current) use of aromatase inhibitors: Secondary | ICD-10-CM | POA: Diagnosis not present

## 2023-07-02 DIAGNOSIS — C50911 Malignant neoplasm of unspecified site of right female breast: Secondary | ICD-10-CM | POA: Diagnosis present

## 2023-07-02 DIAGNOSIS — Z170 Estrogen receptor positive status [ER+]: Secondary | ICD-10-CM | POA: Diagnosis not present

## 2023-07-02 DIAGNOSIS — Z1731 Human epidermal growth factor receptor 2 positive status: Secondary | ICD-10-CM | POA: Diagnosis not present

## 2023-07-02 DIAGNOSIS — Z9221 Personal history of antineoplastic chemotherapy: Secondary | ICD-10-CM | POA: Diagnosis not present

## 2023-07-02 NOTE — Telephone Encounter (Signed)
 Patient has been scheduled for follow-up visit per 07/02/23 LOS.  Pt given an appt calendar with date and time.

## 2023-07-09 ENCOUNTER — Other Ambulatory Visit: Payer: Self-pay | Admitting: Physician Assistant

## 2023-07-09 DIAGNOSIS — E1159 Type 2 diabetes mellitus with other circulatory complications: Secondary | ICD-10-CM

## 2023-07-10 ENCOUNTER — Ambulatory Visit: Payer: PPO | Admitting: "Endocrinology

## 2023-07-31 ENCOUNTER — Other Ambulatory Visit (HOSPITAL_BASED_OUTPATIENT_CLINIC_OR_DEPARTMENT_OTHER): Payer: Self-pay

## 2023-09-25 NOTE — Progress Notes (Signed)
 Subjective:  Patient ID: Heather Waller, female    DOB: 1950-09-14  Age: 73 y.o. MRN: [id number]  Chief Complaint  Patient presents with   Medical Management of Chronic Issues    HPI:  Discussed the use of AI scribe software for clinical note transcription with the patient, who gave verbal consent to proceed.  History of Present Illness   Heather Waller is a 73 year old female who presents with increased sinus drainage.  She has been experiencing increased sinus drainage, primarily affecting her sinuses and leading to a scratchy throat. She inquires about the possibility of taking more than one Claritin a day to manage her symptoms, as she finds Benadryl  makes her sleepy, which is sometimes beneficial at night. Occasionally, she experiences a small, painful swelling in her ear, but it is not consistently present. She reports that occasionally she experiences a small, painful swelling in her ear, but at the time of the visit, she was not experiencing ear pain.  She has a history of diabetes and is currently on Mounjaro . Her blood sugar levels are typically not low, with morning readings around 135 mg/dL, though she recalls one instance of a reading at 100 mg/dL. She is cautious about her insulin  dosage, noting a discrepancy between her current dose of 12 units and the 14 units indicated on her package.  She has experienced some stress and sleep disturbances, noting she was awake since 1 AM on the day of the visit. She attributes this to concerns about staying hydrated and arriving on time for her appointment. She mentions a previous issue with dehydration affecting her potassium levels.  She has a history of hypertension but denies any recent headaches. She reports experiencing stress and lack of sleep, which she feels may affect her blood pressure.          09/26/2023    8:03 AM 02/07/2023    9:32 AM 10/30/2022    8:40 AM 07/24/2022    8:15 AM 03/21/2022   11:12 AM   Depression screen PHQ 2/9  Decreased Interest 0 0 0 0 0  Down, Depressed, Hopeless 0 0 0 0 0  PHQ - 2 Score 0 0 0 0 0  Altered sleeping 1 1 2  0   Tired, decreased energy 1 1 1  0   Change in appetite 0 0 0 0   Feeling bad or failure about yourself  0 0 0 0   Trouble concentrating 0 0 0 0   Moving slowly or fidgety/restless 0 0 0 0   Suicidal thoughts 0 0 0 0   PHQ-9 Score 2 2 3  0   Difficult doing work/chores Not difficult at all Somewhat difficult Not difficult at all Not difficult at all         09/26/2023    8:02 AM  Fall Risk   Falls in the past year? 0  Number falls in past yr: 1  Injury with Fall? 0  Risk for fall due to : No Fall Risks  Follow up Education provided    Patient Care Team: Milon Cleaves, [place] as PCP - General (Physician Assistant) Ezzard Valaria LABOR, MD as Consulting Physician (Oncology) Joesph Lynwood DEL, MD as Consulting Physician (General Surgery)   Review of Systems  Constitutional:  Negative for appetite change, fatigue and fever.  HENT:  Negative for congestion, ear pain, sinus pressure and sore throat.   Respiratory:  Negative for cough, chest tightness, shortness of breath and wheezing.   Cardiovascular:  Negative for chest pain and palpitations.  Gastrointestinal:  Negative for abdominal pain, constipation, diarrhea, nausea and vomiting.  Genitourinary:  Negative for dysuria and hematuria.  Musculoskeletal:  Negative for arthralgias, back pain, joint swelling and myalgias.  Skin:  Negative for rash.  Neurological:  Negative for dizziness, weakness and headaches.  Psychiatric/Behavioral:  Negative for dysphoric mood. The patient is not nervous/anxious.     Current Outpatient Medications on File Prior to Visit  Medication Sig Dispense Refill   anastrozole  (ARIMIDEX ) 1 MG tablet TAKE 1 TABLET BY MOUTH EVERY DAY 90 tablet 3   insulin  aspart (NOVOLOG  FLEXPEN) 100 UNIT/ML FlexPen Inject 12 Units into the skin with breakfast, with lunch, and with evening  meal. Max daily 60 units (Patient taking differently: Inject 14 Units into the skin with breakfast, with lunch, and with evening meal. Max daily 60 units) 45 mL 3   insulin  degludec (TRESIBA  FLEXTOUCH) 200 UNIT/ML FlexTouch Pen INJECT 50 UNITS INTO SKIN DAILY 18 mL 1   Insulin  Pen Needle 32G X 4 MM MISC 1 Device by Does not apply route in the morning, at noon, in the evening, and at bedtime. 400 each 3   loratadine (CLARITIN) 10 MG tablet Take 10 mg by mouth daily.     tirzepatide  (MOUNJARO ) 7.5 MG/0.5ML Pen Inject 7.5 mg into the skin once a week. 6 mL 3   valsartan -hydrochlorothiazide  (DIOVAN -HCT) 80-12.5 MG tablet TAKE 1 TABLET BY MOUTH EVERY DAY 90 tablet 3   No current facility-administered medications on file prior to visit.   Past Medical History:  Diagnosis Date   Breast cancer (HCC)    Diabetes mellitus without complication (HCC)    Hyperlipidemia    Hypertension    Personal history of chemotherapy    Personal history of radiation therapy    Type 2 diabetes mellitus with hyperglycemia (HCC) 02/23/2020   Type 2 diabetes mellitus without complication, with long-term current use of insulin  (HCC) 12/29/2020   Past Surgical History:  Procedure Laterality Date   BREAST LUMPECTOMY Right 07/2020    Family History  Problem Relation Age of Onset   Alzheimer's disease Mother    Hypertension Father    Social History   Socioeconomic History   Marital status: Married    Spouse name: Not on file   Number of children: Not on file   Years of education: Not on file   Highest education level: Not on file  Occupational History   Not on file  Tobacco Use   Smoking status: Never   Smokeless tobacco: Never  Vaping Use   Vaping status: Never Used  Substance and Sexual Activity   Alcohol use: Not Currently   Drug use: Never   Sexual activity: Yes    Partners: Male  Other Topics Concern   Not on file  Social History Narrative   Not on file   Social Drivers of Health   Financial  Resource Strain: Not on file  Food Insecurity: Not on file  Transportation Needs: Not on file  Physical Activity: Not on file  Stress: Not on file  Social Connections: Not on file    Objective:  BP (!) 150/70   Pulse 74   Temp (!) 97.5 F (36.4 C)   Resp 16   Ht 5' 4.5 (1.638 m)   Wt 173 lb (78.5 kg)   SpO2 96%   BMI 29.24 kg/m      09/26/2023    7:55 AM 07/02/2023   11:30 AM 07/02/2023  10:33 AM  BP/Weight  Systolic BP 150 156 171  Diastolic BP 70 69 73  Wt. (Lbs) 173  176  BMI 29.24 kg/m2  29.74 kg/m2    Physical Exam Vitals reviewed.  Constitutional:      Appearance: Normal appearance.  HENT:     Right Ear: Hearing and ear canal normal. No tenderness. No middle ear effusion. There is no impacted cerumen. Tympanic membrane is bulging.     Left Ear: Hearing and ear canal normal. No tenderness.  No middle ear effusion. There is no impacted cerumen. Tympanic membrane is bulging.  Cardiovascular:     Rate and Rhythm: Normal rate and regular rhythm.     Heart sounds: Normal heart sounds.  Pulmonary:     Effort: Pulmonary effort is normal.     Breath sounds: Normal breath sounds.  Abdominal:     General: Bowel sounds are normal.     Palpations: Abdomen is soft.     Tenderness: There is no abdominal tenderness.  Neurological:     Mental Status: She is alert and oriented to person, place, and time.  Psychiatric:        Mood and Affect: Mood normal.        Behavior: Behavior normal.       Lab Results  Component Value Date   WBC CANCELED 05/27/2023   HGB CANCELED 05/27/2023   HCT CANCELED 05/27/2023   PLT CANCELED 05/27/2023   GLUCOSE 143 (H) 06/11/2023   CHOL 190 05/27/2023   TRIG 146 05/27/2023   HDL 54 05/27/2023   LDLCALC 110 (H) 05/27/2023   ALT 18 06/11/2023   AST 16 06/11/2023   NA 134 06/11/2023   K 4.5 06/11/2023   CL 95 (L) 06/11/2023   CREATININE 0.57 06/11/2023   BUN 11 06/11/2023   CO2 22 06/11/2023   TSH 0.81 06/18/2023   HGBA1C 7.4  (H) 05/27/2023      Assessment & Plan:  Diabetic polyneuropathy associated with type 2 diabetes mellitus (HCC) Assessment & Plan: Controlled Denies any changing in symptoms Continue to follow up with endocrinology Next appointment in September   Type 2 diabetes mellitus with hyperglycemia, with long-term current use of insulin  (HCC) Assessment & Plan: Blood glucose well-managed, weight loss indicates positive response to Mounjaro  and insulin . - Draw A1c for endocrinologist appointment in September. - Consider increasing insulin  dose to 14 units based on blood glucose levels. - Send prescription for G Voke pen to pharmacy for emergency hypoglycemia management. Lab Results  Component Value Date   HGBA1C 7.4 (H) 05/27/2023   HGBA1C 7.7 (H) 02/07/2023   HGBA1C 8.9 (H) 10/30/2022     Orders: -     Hemoglobin A1c -     Gvoke HypoPen  2-Pack; Inject 1 Pen into the skin as needed (When sugars are below 70 and symptomatic).  Dispense: 4 mL; Refill: 3  Hypertension associated with diabetes (HCC) Assessment & Plan: Blood pressure slightly elevated, possibly due to stress and lack of sleep. Continue to monitor Denies any headaches, dizziness, or chest pain Will adjust treatment if it continues to be elevated.  BP Readings from Last 3 Encounters:  09/26/23 (!) 150/70  07/02/23 (!) 156/69  06/18/23 124/80     Orders: -     Comprehensive metabolic panel with GFR -     CBC with Differential/Platelet  Dyslipidemia Assessment & Plan: Well controlled.  Continue to work on eating a healthy diet and exercise.  Labs drawn today.   Will adjust  medication as needed depending on labs Lab Results  Component Value Date   LDLCALC 110 (H) 05/27/2023     Orders: -     Lipid panel  Sleep disturbances Assessment & Plan: Denies any worsening or changing symptoms Continue to monitor Woke up early last night, but denies any increase in frequency   Seasonal allergic rhinitis due to  pollen Assessment & Plan: Increased sinus drainage with pressure and redness, suggestive of allergies. - Recommend Claritin in the morning and Benadryl  at night. - Consider using Flonase to help dry out sinus secretions. - Discussed using children's Benadryl  or liquid form for those sensitive to standard doses.      Meds ordered this encounter  Medications   Glucagon  (GVOKE HYPOPEN  2-PACK) 1 MG/0.2ML SOAJ    Sig: Inject 1 Pen into the skin as needed (When sugars are below 70 and symptomatic).    Dispense:  4 mL    Refill:  3    Orders Placed This Encounter  Procedures   Lipid panel   Comprehensive metabolic panel with GFR   CBC with Differential/Platelet   Hemoglobin A1c     Follow-up: Return in about 6 months (around 03/28/2024) for Chronic, Nola, lab visit.   I,Lauren M Auman,acting as a Neurosurgeon for [employer], PA.,have documented all relevant documentation on the behalf of Nola Angles, PA,as directed by  Nola Angles, PA while in the presence of Nola Angles, [place].   An After Visit Summary was printed and given to the patient.  Nola Angles, [place] [facility] [phone]

## 2023-09-26 ENCOUNTER — Encounter: Payer: Self-pay | Admitting: Physician Assistant

## 2023-09-26 ENCOUNTER — Ambulatory Visit (INDEPENDENT_AMBULATORY_CARE_PROVIDER_SITE_OTHER): Admitting: Physician Assistant

## 2023-09-26 VITALS — BP 150/70 | HR 74 | Temp 97.5°F | Resp 16 | Ht 64.5 in | Wt 173.0 lb

## 2023-09-26 DIAGNOSIS — E1165 Type 2 diabetes mellitus with hyperglycemia: Secondary | ICD-10-CM | POA: Diagnosis not present

## 2023-09-26 DIAGNOSIS — E1159 Type 2 diabetes mellitus with other circulatory complications: Secondary | ICD-10-CM

## 2023-09-26 DIAGNOSIS — E1142 Type 2 diabetes mellitus with diabetic polyneuropathy: Secondary | ICD-10-CM | POA: Diagnosis not present

## 2023-09-26 DIAGNOSIS — J301 Allergic rhinitis due to pollen: Secondary | ICD-10-CM

## 2023-09-26 DIAGNOSIS — I152 Hypertension secondary to endocrine disorders: Secondary | ICD-10-CM

## 2023-09-26 DIAGNOSIS — G479 Sleep disorder, unspecified: Secondary | ICD-10-CM

## 2023-09-26 DIAGNOSIS — E785 Hyperlipidemia, unspecified: Secondary | ICD-10-CM

## 2023-09-26 DIAGNOSIS — Z794 Long term (current) use of insulin: Secondary | ICD-10-CM

## 2023-09-26 DIAGNOSIS — J309 Allergic rhinitis, unspecified: Secondary | ICD-10-CM | POA: Insufficient documentation

## 2023-09-26 MED ORDER — GVOKE HYPOPEN 2-PACK 1 MG/0.2ML ~~LOC~~ SOAJ
1.0000 | SUBCUTANEOUS | 3 refills | Status: AC | PRN
Start: 2023-09-26 — End: ?

## 2023-09-26 NOTE — Assessment & Plan Note (Signed)
 Blood glucose well-managed, weight loss indicates positive response to Mounjaro  and insulin . - Draw A1c for endocrinologist appointment in September. - Consider increasing insulin  dose to 14 units based on blood glucose levels. - Send prescription for G Voke pen to pharmacy for emergency hypoglycemia management. Lab Results  Component Value Date   HGBA1C 7.4 (H) 05/27/2023   HGBA1C 7.7 (H) 02/07/2023   HGBA1C 8.9 (H) 10/30/2022

## 2023-09-26 NOTE — Assessment & Plan Note (Signed)
 Denies any worsening or changing symptoms Continue to monitor Woke up early last night, but denies any increase in frequency

## 2023-09-26 NOTE — Patient Instructions (Signed)
 VISIT SUMMARY:  During your visit, we discussed your increased sinus drainage and its impact on your throat, as well as your diabetes management, blood pressure, and potassium levels. We also reviewed your current medications and made some adjustments to better manage your symptoms and overall health.  YOUR PLAN:  -TYPE 2 DIABETES MELLITUS: Type 2 diabetes is a condition where your body does not use insulin  properly, leading to high blood sugar levels. Your blood glucose is well-managed, and your weight loss indicates a positive response to Mounjaro  and insulin . We will draw your A1c for your endocrinologist appointment in September. Consider increasing your insulin  dose to 14 units based on your blood glucose levels. A prescription for a G Voke pen will be sent to the pharmacy for emergency hypoglycemia management.  -HYPERTENSION: Hypertension is high blood pressure, which can be influenced by stress and lack of sleep. Your blood pressure was slightly elevated, possibly due to these factors. We will continue to monitor it.  -FLUCTUATING POTASSIUM LEVELS: Potassium is an essential mineral that helps regulate heart and muscle function. Due to previous fluctuations in your potassium levels, we will order labs to check them.  -ALLERGIC RHINITIS: Allergic rhinitis is an allergic reaction that causes sneezing, congestion, and sinus drainage. To manage your symptoms, take Claritin in the morning and Benadryl  at night. Consider using Flonase to help dry out sinus secretions. We also discussed using children's Benadryl  or a liquid form if you are sensitive to standard doses.  INSTRUCTIONS:  Please follow up with your endocrinologist in September for your A1c test. Continue monitoring your blood glucose levels and consider adjusting your insulin  dose to 14 units. Pick up your G Voke pen from the pharmacy for emergency hypoglycemia management. We will also need to check your potassium levels, so please  complete the lab work as ordered. Continue taking Claritin in the morning and Benadryl  at night, and consider using Flonase for your sinus drainage. If you have any concerns or experience any new symptoms, please contact our office.

## 2023-09-26 NOTE — Assessment & Plan Note (Signed)
 Controlled Denies any changing in symptoms Continue to follow up with endocrinology Next appointment in September

## 2023-09-26 NOTE — Assessment & Plan Note (Signed)
 Increased sinus drainage with pressure and redness, suggestive of allergies. - Recommend Claritin in the morning and Benadryl  at night. - Consider using Flonase to help dry out sinus secretions. - Discussed using children's Benadryl  or liquid form for those sensitive to standard doses.

## 2023-09-26 NOTE — Assessment & Plan Note (Signed)
 Blood pressure slightly elevated, possibly due to stress and lack of sleep. Continue to monitor Denies any headaches, dizziness, or chest pain Will adjust treatment if it continues to be elevated.  BP Readings from Last 3 Encounters:  09/26/23 (!) 150/70  07/02/23 (!) 156/69  06/18/23 124/80

## 2023-09-26 NOTE — Assessment & Plan Note (Signed)
 Well controlled.  Continue to work on eating a healthy diet and exercise.  Labs drawn today.   Will adjust medication as needed depending on labs Lab Results  Component Value Date   LDLCALC 110 (H) 05/27/2023

## 2023-09-27 LAB — CBC WITH DIFFERENTIAL/PLATELET
Basophils Absolute: 0.1 x10E3/uL (ref 0.0–0.2)
Basos: 1 %
EOS (ABSOLUTE): 0.1 x10E3/uL (ref 0.0–0.4)
Eos: 1 %
Hematocrit: 44.8 % (ref 34.0–46.6)
Hemoglobin: 14.8 g/dL (ref 11.1–15.9)
Immature Grans (Abs): 0.1 x10E3/uL (ref 0.0–0.1)
Immature Granulocytes: 1 %
Lymphocytes Absolute: 2.6 x10E3/uL (ref 0.7–3.1)
Lymphs: 23 %
MCH: 30.2 pg (ref 26.6–33.0)
MCHC: 33 g/dL (ref 31.5–35.7)
MCV: 91 fL (ref 79–97)
Monocytes Absolute: 0.6 x10E3/uL (ref 0.1–0.9)
Monocytes: 6 %
Neutrophils Absolute: 8 x10E3/uL — ABNORMAL HIGH (ref 1.4–7.0)
Neutrophils: 68 %
Platelets: 276 x10E3/uL (ref 150–450)
RBC: 4.9 x10E6/uL (ref 3.77–5.28)
RDW: 12.8 % (ref 11.7–15.4)
WBC: 11.4 x10E3/uL — ABNORMAL HIGH (ref 3.4–10.8)

## 2023-09-27 LAB — COMPREHENSIVE METABOLIC PANEL WITH GFR
ALT: 17 IU/L (ref 0–32)
AST: 21 IU/L (ref 0–40)
Albumin: 4.4 g/dL (ref 3.8–4.8)
Alkaline Phosphatase: 68 IU/L (ref 44–121)
BUN/Creatinine Ratio: 15 (ref 12–28)
BUN: 9 mg/dL (ref 8–27)
Bilirubin Total: 0.4 mg/dL (ref 0.0–1.2)
CO2: 24 mmol/L (ref 20–29)
Calcium: 9.7 mg/dL (ref 8.7–10.3)
Chloride: 95 mmol/L — ABNORMAL LOW (ref 96–106)
Creatinine, Ser: 0.6 mg/dL (ref 0.57–1.00)
Globulin, Total: 2.9 g/dL (ref 1.5–4.5)
Glucose: 137 mg/dL — ABNORMAL HIGH (ref 70–99)
Potassium: 4.5 mmol/L (ref 3.5–5.2)
Sodium: 135 mmol/L (ref 134–144)
Total Protein: 7.3 g/dL (ref 6.0–8.5)
eGFR: 94 mL/min/1.73 (ref >59)

## 2023-09-27 LAB — LIPID PANEL
Chol/HDL Ratio: 3.3 ratio (ref 0.0–4.4)
Cholesterol, Total: 171 mg/dL (ref 100–199)
HDL: 52 mg/dL (ref >39)
LDL Chol Calc (NIH): 99 mg/dL (ref 0–99)
Triglycerides: 109 mg/dL (ref 0–149)
VLDL Cholesterol Cal: 20 mg/dL (ref 5–40)

## 2023-09-27 LAB — HEMOGLOBIN A1C
Est. average glucose Bld gHb Est-mCnc: 160 mg/dL
Hgb A1c MFr Bld: 7.2 % — ABNORMAL HIGH (ref 4.8–5.6)

## 2023-10-01 ENCOUNTER — Other Ambulatory Visit: Payer: Self-pay | Admitting: Physician Assistant

## 2023-10-01 ENCOUNTER — Ambulatory Visit: Payer: Self-pay | Admitting: Physician Assistant

## 2023-10-01 DIAGNOSIS — J0100 Acute maxillary sinusitis, unspecified: Secondary | ICD-10-CM

## 2023-10-01 MED ORDER — AZITHROMYCIN 250 MG PO TABS: ORAL_TABLET | ORAL | 0 refills | Status: AC

## 2023-10-22 ENCOUNTER — Ambulatory Visit (INDEPENDENT_AMBULATORY_CARE_PROVIDER_SITE_OTHER): Admitting: Internal Medicine

## 2023-10-22 ENCOUNTER — Other Ambulatory Visit (HOSPITAL_BASED_OUTPATIENT_CLINIC_OR_DEPARTMENT_OTHER): Payer: Self-pay

## 2023-10-22 ENCOUNTER — Encounter: Payer: Self-pay | Admitting: Internal Medicine

## 2023-10-22 DIAGNOSIS — Z794 Long term (current) use of insulin: Secondary | ICD-10-CM | POA: Diagnosis not present

## 2023-10-22 DIAGNOSIS — E1142 Type 2 diabetes mellitus with diabetic polyneuropathy: Secondary | ICD-10-CM

## 2023-10-22 MED ORDER — TRESIBA FLEXTOUCH 200 UNIT/ML ~~LOC~~ SOPN
50.0000 [IU] | PEN_INJECTOR | SUBCUTANEOUS | 6 refills | Status: AC
  Filled 2024-01-26 – 2024-02-17 (×2): qty 9, 36d supply, fill #0
  Filled 2024-03-19: qty 9, 36d supply, fill #1

## 2023-10-22 MED ORDER — INSULIN PEN NEEDLE 32G X 4 MM MISC
1.0000 | Freq: Four times a day (QID) | 3 refills | Status: AC
  Filled 2024-03-19: qty 400, 100d supply, fill #0

## 2023-10-22 MED ORDER — TIRZEPATIDE 7.5 MG/0.5ML ~~LOC~~ SOAJ
7.5000 mg | SUBCUTANEOUS | 3 refills | Status: AC
Start: 2023-10-22 — End: ?
  Filled 2023-10-22: qty 6, 84d supply, fill #0
  Filled 2024-01-26: qty 6, 84d supply, fill #1

## 2023-10-22 MED ORDER — NOVOLOG FLEXPEN 100 UNIT/ML ~~LOC~~ SOPN
12.0000 [IU] | PEN_INJECTOR | Freq: Three times a day (TID) | SUBCUTANEOUS | 3 refills | Status: AC
  Filled 2024-03-19: qty 30, 83d supply, fill #0

## 2023-10-22 NOTE — Patient Instructions (Signed)
 Continue Mounjaro  7.5 mg once weekly  Continue Tresiba   50  units daily  Take  Novolog  6-12 units with each meal     HOW TO TREAT LOW BLOOD SUGARS (Blood sugar LESS THAN 70 MG/DL) Please follow the RULE OF 15 for the treatment of hypoglycemia treatment (when your (blood sugars are less than 70 mg/dL)   STEP 1: Take 15 grams of carbohydrates when your blood sugar is low, which includes:  3-4 GLUCOSE TABS  OR 3-4 OZ OF JUICE OR REGULAR SODA OR ONE TUBE OF GLUCOSE GEL    STEP 2: RECHECK blood sugar in 15 MINUTES STEP 3: If your blood sugar is still low at the 15 minute recheck --> then, go back to STEP 1 and treat AGAIN with another 15 grams of carbohydrate

## 2023-10-22 NOTE — Progress Notes (Signed)
 Name: Heather Waller  Age/ Sex: 73 y.o., female   MRN/ DOB: [id number], [birth year]     PCP: Milon Cleaves, PA   Reason for Endocrinology Evaluation: Type 2 Diabetes Mellitus  Initial Endocrine Consultative Visit: 02/23/2020    PATIENT IDENTIFIER: Heather Waller is a 73 y.o. female with a past medical history of T2DM, HTN , Hx breast CA (status postlumpectomy 07/2020 )and dyslipidemia. The patient has followed with Endocrinology clinic since 02/23/2020 for consultative assistance with management of her diabetes.     DIABETIC HISTORY:  Heather Waller was diagnosed with DM yrs ago,Intolerant to Metformin due to diarrhea .  Her hemoglobin A1c has ranged from 10.7% in 2021, peaking at 10.8%  in 2022.    On her initial visit to our clinic she had an A1c of 10.8%, she was on Ozempic  and Tresiba  which we continued and added prandial insulin .  She was started on Farxiga  by her PCP 03/2022 with an A1c 9.2% but it was out of stock, so she was started on Mounjaro     SUBJECTIVE:   During the last visit (06/18/2023): A1c 7.4%      Today (10/22/2023): Ms. [name] is here for a follow up on diabetes management.  She checks her blood sugars 3 times daily. The patient has had hypoglycemic episodes since the last clinic visit. She is symptomatic .  Hypoglycemia is rare, she attributed hypoglycemic episode due to change in eating pattern while spouse was hospitalized for CVA   Unfortunately, spouse passed away recently due to CVA complications   She continues to follow-up with oncology for history of breast cancer Patient has been noted weight loss No nausea or vomiting  No constipation or diarrhea   HOME DIABETES REGIMEN:  Mounjaro  7.5 mg weekly Tresiba  50 units daily NovoLog  12 units TIDQAC    Statin: Declines (02/2020) ACE-I/ARB: Yes    GLUCOSE LOG:  94 - 183 mg/dL     DIABETIC COMPLICATIONS: Microvascular complications:  Neuropathy- chemo related  Denies:  CKD, retinopathy Last Eye Exam: Completed 11/25/2022  Macrovascular complications:   Denies: CAD, CVA, PVD   HISTORY:  Past Medical History:  Past Medical History:  Diagnosis Date   Breast cancer (HCC)    Diabetes mellitus without complication (HCC)    Hyperlipidemia    Hypertension    Personal history of chemotherapy    Personal history of radiation therapy    Type 2 diabetes mellitus with hyperglycemia (HCC) 02/23/2020   Type 2 diabetes mellitus without complication, with long-term current use of insulin  (HCC) 12/29/2020   Past Surgical History:  Past Surgical History:  Procedure Laterality Date   BREAST LUMPECTOMY Right 07/2020   Social History:  reports that she has never smoked. She has never used smokeless tobacco. She reports that she does not currently use alcohol. She reports that she does not use drugs. Family History:  Family History  Problem Relation Age of Onset   Alzheimer's disease Mother    Hypertension Father      HOME MEDICATIONS: Allergies as of 10/22/2023       Reactions   Latex Itching   Codeine    Iodine    Oxycodone          Medication List        Accurate as of October 22, 2023 11:30 AM. If you have any questions, ask your nurse or doctor.          anastrozole  1 MG tablet Commonly known as: ARIMIDEX  TAKE 1 TABLET  BY MOUTH EVERY DAY   Gvoke HypoPen  2-Pack 1 MG/0.2ML Soaj Generic drug: Glucagon  Inject 1 Pen into the skin as needed (When sugars are below 70 and symptomatic).   Insulin  Pen Needle 32G X 4 MM Misc 1 Device by Does not apply route in the morning, at noon, in the evening, and at bedtime.   loratadine 10 MG tablet Commonly known as: CLARITIN Take 10 mg by mouth daily.   Mounjaro  7.5 MG/0.5ML Pen Generic drug: tirzepatide  Inject 7.5 mg into the skin once a week.   NovoLOG  FlexPen 100 UNIT/ML FlexPen Generic drug: insulin  aspart Inject 12 Units into the skin with breakfast, with lunch, and with evening meal.  Max daily 60 units What changed: how much to take   Tresiba  FlexTouch 200 UNIT/ML FlexTouch Pen Generic drug: insulin  degludec INJECT 50 UNITS INTO SKIN DAILY   valsartan -hydrochlorothiazide  80-12.5 MG tablet Commonly known as: DIOVAN -HCT TAKE 1 TABLET BY MOUTH EVERY DAY         OBJECTIVE:   Vital Signs: BP 124/72 (BP Location: Left Arm, Patient Position: Sitting, Cuff Size: Normal)   Pulse 87   Ht 5' 4.5 (1.638 m)   Wt 168 lb (76.2 kg)   SpO2 97%   BMI 28.39 kg/m   Wt Readings from Last 3 Encounters:  10/22/23 168 lb (76.2 kg)  09/26/23 173 lb (78.5 kg)  07/02/23 176 lb (79.8 kg)     Exam: General: Pt appears well and is in NAD  Lungs: Clear with good BS bilat   Heart: RRR   Extremities: No   pretibial edema.   Neuro: MS is good with appropriate affect, pt is alert and Ox3    DM foot exam:03/12/2023     The skin of the feet is intact without sores or ulcerations. The pedal pulses are 2+ on right and 2+ on left. The sensation is intact to a screening 5.07, 10 gram monofilament bilaterally    DATA REVIEWED:  Lab Results  Component Value Date   HGBA1C 7.2 (H) 09/26/2023   HGBA1C 7.4 (H) 05/27/2023   HGBA1C 7.7 (H) 02/07/2023     Latest Reference Range & Units 09/26/23 08:50  Sodium 134 - 144 mmol/L 135  Potassium 3.5 - 5.2 mmol/L 4.5  Chloride 96 - 106 mmol/L 95 (L)  CO2 20 - 29 mmol/L 24  Glucose 70 - 99 mg/dL 862 (H)  BUN 8 - 27 mg/dL 9  Creatinine 9.42 - 8.99 mg/dL 9.39  Calcium 8.7 - 89.6 mg/dL 9.7  BUN/Creatinine Ratio 12 - 28  15  eGFR >59 mL/min/1.73 94  Alkaline Phosphatase 44 - 121 IU/L 68  Albumin 3.8 - 4.8 g/dL 4.4  AST 0 - 40 IU/L 21  ALT 0 - 32 IU/L 17  Total Protein 6.0 - 8.5 g/dL 7.3  Total Bilirubin 0.0 - 1.2 mg/dL 0.4  Total CHOL/HDL Ratio 0.0 - 4.4 ratio 3.3  Cholesterol, Total 100 - 199 mg/dL 828  HDL Cholesterol >60 mg/dL 52  Triglycerides 0 - 850 mg/dL 890  VLDL Cholesterol Cal 5 - 40 mg/dL 20  LDL Chol Calc (NIH) 0 -  99 mg/dL 99    Old records , labs and images have been reviewed.   ASSESSMENT / PLAN / RECOMMENDATIONS:   1) Type 2 Diabetes Mellitus, Sub-Optimally  controlled, With neuropathic  complications - Most recent A1c of  7.2%. Goal A1c < 7.0 %.    -A1c continues to trend down -Patient reported constipation with Ozempic  - She had issues  with learning and maneuvering CGM in the past and opted with finger sticks  -Labs reviewed through PCPs office - Mounjaro  and Tresiba  remain the same, the only change is I have advised the patient to reduce NovoLog  by 50%, if she is going to eat a smaller meal than usual, to prevent hypoglycemia   MEDICATIONS: Continue Mounjaro  7.5 mg weekly  Continue Tresiba  50 units daily Take NovoLog  6-12 units with each meal   EDUCATION / INSTRUCTIONS: BG monitoring instructions: Patient is instructed to check her blood sugars 3 times a day, before meals . Call [facility] if: BG persistently < 70  I reviewed the Rule of 15 for the treatment of hypoglycemia in detail with the patient. Literature supplied.   2) Diabetic complications:  Eye: Does not have known diabetic retinopathy.  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  Renal: Patient does not have known baseline CKD. She   is  on an ACEI/ARB at present.      3) Dyslipidemia :  -Historically her LDL has been below 100 Mg/DL, recent LDL 99 MGs/DL -We have discussed cardiovascular benefits of statin therapy in the past, but patient opted to remain off statins   F/U in 6 months     Signed electronically by: Stefano Redgie Butts, MD  [facility]  [facility] [address] [place] [address] [place], [place] [address] Phone: [phone] FAX: [phone]   CC: Milon Cleaves, PA [address] [place] [address] Phone: [phone]  Fax: [phone]  Return to Endocrinology clinic as below: Future Appointments  Date Time  Provider Department Center  01/02/2024 10:00 AM Ezzard Valaria LABOR, MD [facility] None  03/30/2024  8:20 AM Milon Cleaves, PA [facility]  06/17/2024 10:00 AM [facility] 2 [facility]

## 2023-11-29 ENCOUNTER — Other Ambulatory Visit: Payer: Self-pay | Admitting: Hematology and Oncology

## 2023-11-29 DIAGNOSIS — C50411 Malignant neoplasm of upper-outer quadrant of right female breast: Secondary | ICD-10-CM

## 2024-01-01 NOTE — Progress Notes (Signed)
 [facility] [address] [place],  [place]  [address] [phone]  Clinic Day:  01/02/2024  Referring physician: Milon Cleaves, PA  HISTORY OF PRESENT ILLNESS:  The patient is a 73 y.o. female with stage IA (T1c N0 M0) hormone/Her2 positive right breast cancer, status post a lumpectomy in June 2022.  She completed 6 cycles of adjuvant TCH chemotherapy in October 2022. She completed her maintenance Herceptin  in June 2023.  She remains on anastrozole  for her adjuvant endocrine therapy.  She comes in today for routine follow-up.  Since her last visit, the patient has been doing well.  She denies having any particular changes in her breasts which concern her for disease recurrence.  Her last annual mammogram done in May 2025 showed no evidence of disease recurrence.  VITALS:  Blood pressure (!) 149/63, pulse 81, temperature 98.1 F (36.7 C), temperature source Oral, resp. rate 14, height 5' 4.5 (1.638 m), weight 164 lb (74.4 kg), SpO2 96%.  Wt Readings from Last 3 Encounters:  01/02/24 164 lb (74.4 kg)  10/22/23 168 lb (76.2 kg)  09/26/23 173 lb (78.5 kg)    Body mass index is 27.72 kg/m.  Performance status (ECOG): 1  PHYSICAL EXAM:  Physical Exam Constitutional:      General: She is not in acute distress.    Appearance: Normal appearance. She is normal weight.  HENT:     Head: Normocephalic and atraumatic.     Mouth/Throat:     Pharynx: Oropharynx is clear. No oropharyngeal exudate.  Eyes:     General: No scleral icterus.    Extraocular Movements: Extraocular movements intact.     Conjunctiva/sclera: Conjunctivae normal.     Pupils: Pupils are equal, round, and reactive to light.  Cardiovascular:     Rate and Rhythm: Normal rate and regular rhythm.     Pulses: Normal pulses.     Heart sounds: Normal heart sounds. No murmur heard.    No friction rub. No gallop.  Pulmonary:     Effort: Pulmonary effort is normal. No respiratory distress.      Breath sounds: Normal breath sounds.  Chest:  Breasts:    Right: No swelling, bleeding, inverted nipple, mass, nipple discharge or skin change.     Left: No swelling, bleeding, inverted nipple, mass, nipple discharge or skin change.  Abdominal:     General: Bowel sounds are normal. There is no distension.     Palpations: Abdomen is soft. There is no hepatomegaly, splenomegaly or mass.     Tenderness: There is no abdominal tenderness.  Musculoskeletal:        General: No tenderness. Normal range of motion.     Cervical back: Normal range of motion and neck supple.     Right lower leg: No edema.     Left lower leg: No edema.  Lymphadenopathy:     Cervical: No cervical adenopathy.     Right cervical: No superficial, deep or posterior cervical adenopathy.    Left cervical: No superficial, deep or posterior cervical adenopathy.     Upper Body:     Right upper body: No supraclavicular or axillary adenopathy.     Left upper body: No supraclavicular or axillary adenopathy.     Lower Body: No right inguinal adenopathy. No left inguinal adenopathy.  Skin:    General: Skin is warm and dry.     Coloration: Skin is not jaundiced.     Findings: No lesion or rash.  Neurological:  General: No focal deficit present.     Mental Status: She is alert and oriented to person, place, and time. Mental status is at baseline.  Psychiatric:        Mood and Affect: Mood normal.        Behavior: Behavior normal.        Thought Content: Thought content normal.        Judgment: Judgment normal.    ASSESSMENT & PLAN:  Assessment/Plan:  A  73 y.o. female with stage IA (T1c N0 M0) hormone/Her2 positive breast cancer, status post a right breast lumpectomy in June 2022, followed by [facility] x 6 and maintenance Herceptin  x 1 year.  Based upon her clinical breast exam today, the patient remains disease-free.  She knows to continue taking her anastrozole  daily x 5 years for her adjuvant endocrine therapy.  Overall,  she appears to be doing well.  I we will see her back in 6 months for her next clinical breast exam.  Her annual mammogram will be scheduled before her next visit for her continued radiographic breast cancer surveillance.  A DEXA scan will also be done at that time to ensure her anastrozole  is not leading to any significant changes in her bone health.  The patient understands all the plans discussed today and is in agreement with them.    Kiree Dejarnette DELENA Kerns, MD

## 2024-01-02 ENCOUNTER — Other Ambulatory Visit: Payer: Self-pay | Admitting: Oncology

## 2024-01-02 ENCOUNTER — Inpatient Hospital Stay: Attending: Oncology | Admitting: Oncology

## 2024-01-02 ENCOUNTER — Telehealth: Payer: Self-pay | Admitting: Oncology

## 2024-01-02 VITALS — BP 149/63 | HR 81 | Temp 98.1°F | Resp 14 | Ht 64.5 in | Wt 164.0 lb

## 2024-01-02 DIAGNOSIS — Z170 Estrogen receptor positive status [ER+]: Secondary | ICD-10-CM | POA: Diagnosis not present

## 2024-01-02 DIAGNOSIS — Z9221 Personal history of antineoplastic chemotherapy: Secondary | ICD-10-CM | POA: Insufficient documentation

## 2024-01-02 DIAGNOSIS — Z79811 Long term (current) use of aromatase inhibitors: Secondary | ICD-10-CM | POA: Diagnosis not present

## 2024-01-02 DIAGNOSIS — C50911 Malignant neoplasm of unspecified site of right female breast: Secondary | ICD-10-CM | POA: Insufficient documentation

## 2024-01-02 DIAGNOSIS — C50411 Malignant neoplasm of upper-outer quadrant of right female breast: Secondary | ICD-10-CM

## 2024-01-02 NOTE — Telephone Encounter (Signed)
 Patient has been scheduled for follow-up visit per 01/02/2024 LOS.  Pt given an appt calendar with date and time.

## 2024-01-26 ENCOUNTER — Other Ambulatory Visit (HOSPITAL_BASED_OUTPATIENT_CLINIC_OR_DEPARTMENT_OTHER): Payer: Self-pay

## 2024-01-26 MED ORDER — TIRZEPATIDE 7.5 MG/0.5ML ~~LOC~~ SOAJ: 7.5000 mg | SUBCUTANEOUS | 3 refills | Status: AC

## 2024-02-17 ENCOUNTER — Other Ambulatory Visit (HOSPITAL_BASED_OUTPATIENT_CLINIC_OR_DEPARTMENT_OTHER): Payer: Self-pay

## 2024-02-17 MED FILL — Anastrozole Tab 1 MG: ORAL | 90 days supply | Qty: 90 | Fill #0 | Status: AC

## 2024-03-19 ENCOUNTER — Other Ambulatory Visit: Payer: Self-pay

## 2024-03-19 ENCOUNTER — Other Ambulatory Visit (HOSPITAL_BASED_OUTPATIENT_CLINIC_OR_DEPARTMENT_OTHER): Payer: Self-pay

## 2024-03-30 ENCOUNTER — Ambulatory Visit: Admitting: Physician Assistant

## 2024-04-20 ENCOUNTER — Ambulatory Visit: Admitting: Internal Medicine

## 2024-06-16 ENCOUNTER — Other Ambulatory Visit (HOSPITAL_BASED_OUTPATIENT_CLINIC_OR_DEPARTMENT_OTHER): Admitting: Radiology

## 2024-06-17 ENCOUNTER — Ambulatory Visit

## 2024-07-01 ENCOUNTER — Inpatient Hospital Stay: Admitting: Oncology
